# Patient Record
Sex: Female | Born: 1945 | Race: White | Hispanic: No | Marital: Married | Smoking: Never smoker
Health system: Southern US, Community
[De-identification: ages and names within clinical notes are randomized; demographics above are authoritative.]

## PROBLEM LIST (undated history)

## (undated) DIAGNOSIS — M8580 Other specified disorders of bone density and structure, unspecified site: Secondary | ICD-10-CM

## (undated) DIAGNOSIS — K219 Gastro-esophageal reflux disease without esophagitis: Secondary | ICD-10-CM

## (undated) DIAGNOSIS — Z8619 Personal history of other infectious and parasitic diseases: Secondary | ICD-10-CM

## (undated) DIAGNOSIS — E785 Hyperlipidemia, unspecified: Secondary | ICD-10-CM

## (undated) DIAGNOSIS — D649 Anemia, unspecified: Secondary | ICD-10-CM

## (undated) DIAGNOSIS — M797 Fibromyalgia: Secondary | ICD-10-CM

## (undated) DIAGNOSIS — M353 Polymyalgia rheumatica: Secondary | ICD-10-CM

## (undated) HISTORY — DX: Hyperlipidemia, unspecified: E78.5

## (undated) HISTORY — DX: Other specified disorders of bone density and structure, unspecified site: M85.80

## (undated) HISTORY — DX: Fibromyalgia: M79.7

## (undated) HISTORY — DX: Polymyalgia rheumatica: M35.3

## (undated) HISTORY — DX: Anemia, unspecified: D64.9

## (undated) HISTORY — DX: Gastro-esophageal reflux disease without esophagitis: K21.9

## (undated) HISTORY — PX: OTHER SURGICAL HISTORY: SHX169

## (undated) HISTORY — DX: Personal history of other infectious and parasitic diseases: Z86.19

---

## 1980-04-27 HISTORY — PX: ABDOMINAL HYSTERECTOMY: SHX81

## 1998-03-06 ENCOUNTER — Other Ambulatory Visit: Admission: RE | Admit: 1998-03-06 | Discharge: 1998-03-06 | Payer: Self-pay | Admitting: Family Medicine

## 1999-09-10 ENCOUNTER — Ambulatory Visit (HOSPITAL_COMMUNITY): Admission: RE | Admit: 1999-09-10 | Discharge: 1999-09-10 | Payer: Self-pay | Admitting: Gastroenterology

## 2002-08-11 ENCOUNTER — Encounter (INDEPENDENT_AMBULATORY_CARE_PROVIDER_SITE_OTHER): Payer: Self-pay | Admitting: Specialist

## 2002-08-11 ENCOUNTER — Ambulatory Visit (HOSPITAL_COMMUNITY): Admission: RE | Admit: 2002-08-11 | Discharge: 2002-08-11 | Payer: Self-pay | Admitting: Gastroenterology

## 2003-04-28 DIAGNOSIS — M353 Polymyalgia rheumatica: Secondary | ICD-10-CM

## 2003-04-28 HISTORY — DX: Polymyalgia rheumatica: M35.3

## 2003-08-20 ENCOUNTER — Encounter: Admission: RE | Admit: 2003-08-20 | Discharge: 2003-08-20 | Payer: Self-pay

## 2003-10-31 ENCOUNTER — Ambulatory Visit (HOSPITAL_COMMUNITY): Admission: RE | Admit: 2003-10-31 | Discharge: 2003-10-31 | Payer: Self-pay | Admitting: Unknown Physician Specialty

## 2004-02-01 ENCOUNTER — Emergency Department (HOSPITAL_COMMUNITY): Admission: EM | Admit: 2004-02-01 | Discharge: 2004-02-02 | Payer: Self-pay | Admitting: Emergency Medicine

## 2004-04-27 DIAGNOSIS — M797 Fibromyalgia: Secondary | ICD-10-CM

## 2004-04-27 HISTORY — DX: Fibromyalgia: M79.7

## 2005-01-27 ENCOUNTER — Encounter: Admission: RE | Admit: 2005-01-27 | Discharge: 2005-01-27 | Payer: Self-pay | Admitting: Gastroenterology

## 2005-02-04 ENCOUNTER — Encounter: Admission: RE | Admit: 2005-02-04 | Discharge: 2005-02-04 | Payer: Self-pay | Admitting: Gastroenterology

## 2005-03-30 ENCOUNTER — Encounter: Admission: RE | Admit: 2005-03-30 | Discharge: 2005-03-30 | Payer: Self-pay | Admitting: Gastroenterology

## 2006-06-07 ENCOUNTER — Ambulatory Visit: Payer: Self-pay | Admitting: Oncology

## 2006-07-02 LAB — COMPREHENSIVE METABOLIC PANEL
ALT: 18 U/L (ref 0–35)
AST: 14 U/L (ref 0–37)
CO2: 29 mEq/L (ref 19–32)
Calcium: 9.2 mg/dL (ref 8.4–10.5)
Chloride: 102 mEq/L (ref 96–112)
Potassium: 4.6 mEq/L (ref 3.5–5.3)
Sodium: 139 mEq/L (ref 135–145)
Total Protein: 7 g/dL (ref 6.0–8.3)

## 2006-07-02 LAB — CBC & DIFF AND RETIC
BASO%: 0 % (ref 0.0–2.0)
EOS%: 0 % (ref 0.0–7.0)
IRF: 0.41 — ABNORMAL HIGH (ref 0.130–0.330)
MCH: 25.8 pg — ABNORMAL LOW (ref 26.0–34.0)
MCHC: 32.3 g/dL (ref 32.0–36.0)
RBC: 4.83 10*6/uL (ref 3.70–5.32)
RDW: 19 % — ABNORMAL HIGH (ref 11.3–14.5)
RETIC #: 49.3 10*3/uL (ref 19.7–115.1)
lymph#: 0.7 10*3/uL — ABNORMAL LOW (ref 0.9–3.3)

## 2006-07-02 LAB — IRON AND TIBC: UIBC: 312 ug/dL

## 2006-07-02 LAB — VITAMIN B12: Vitamin B-12: 428 pg/mL (ref 211–911)

## 2006-07-02 LAB — LACTATE DEHYDROGENASE: LDH: 155 U/L (ref 94–250)

## 2006-07-10 LAB — INTRINSIC FACTOR ANTIBODIES: Intrinsic Factor: NEGATIVE

## 2006-07-10 LAB — PARIETAL CELL ANTIBODY, TOTAL: Parietal Cell Antibody-IgG: 1:40 {titer} — AB

## 2006-08-30 ENCOUNTER — Ambulatory Visit: Payer: Self-pay | Admitting: Oncology

## 2006-09-01 LAB — CBC WITH DIFFERENTIAL/PLATELET
BASO%: 0.3 % (ref 0.0–2.0)
Basophils Absolute: 0 10*3/uL (ref 0.0–0.1)
EOS%: 0.9 % (ref 0.0–7.0)
HGB: 14 g/dL (ref 11.6–15.9)
MCH: 30.2 pg (ref 26.0–34.0)
NEUT#: 8.5 10*3/uL — ABNORMAL HIGH (ref 1.5–6.5)
RBC: 4.63 10*6/uL (ref 3.70–5.32)
RDW: 20.1 % — ABNORMAL HIGH (ref 11.3–14.5)
lymph#: 1.2 10*3/uL (ref 0.9–3.3)

## 2006-09-01 LAB — ERYTHROCYTE SEDIMENTATION RATE: Sed Rate: 8 mm/hr (ref 0–30)

## 2006-09-01 LAB — FERRITIN: Ferritin: 270 ng/mL (ref 10–291)

## 2006-09-01 LAB — IRON AND TIBC
Iron: 65 ug/dL (ref 42–145)
UIBC: 166 ug/dL

## 2006-10-26 ENCOUNTER — Ambulatory Visit: Payer: Self-pay | Admitting: Oncology

## 2006-10-28 LAB — CBC WITH DIFFERENTIAL/PLATELET
Basophils Absolute: 0 10*3/uL (ref 0.0–0.1)
Eosinophils Absolute: 0.1 10*3/uL (ref 0.0–0.5)
HGB: 14.5 g/dL (ref 11.6–15.9)
MCV: 92.3 fL (ref 81.0–101.0)
NEUT#: 5.4 10*3/uL (ref 1.5–6.5)
RDW: 13 % (ref 11.3–14.5)
lymph#: 1.3 10*3/uL (ref 0.9–3.3)

## 2006-10-28 LAB — LACTATE DEHYDROGENASE: LDH: 146 U/L (ref 94–250)

## 2006-10-28 LAB — COMPREHENSIVE METABOLIC PANEL
Albumin: 4.3 g/dL (ref 3.5–5.2)
CO2: 29 mEq/L (ref 19–32)
Glucose, Bld: 75 mg/dL (ref 70–99)
Potassium: 4 mEq/L (ref 3.5–5.3)
Sodium: 141 mEq/L (ref 135–145)
Total Protein: 6.8 g/dL (ref 6.0–8.3)

## 2006-10-28 LAB — FERRITIN: Ferritin: 283 ng/mL (ref 10–291)

## 2006-10-28 LAB — IRON AND TIBC: %SAT: 63 % — ABNORMAL HIGH (ref 20–55)

## 2007-03-01 ENCOUNTER — Ambulatory Visit: Payer: Self-pay | Admitting: Oncology

## 2007-03-03 LAB — COMPREHENSIVE METABOLIC PANEL
Albumin: 4.1 g/dL (ref 3.5–5.2)
CO2: 29 mEq/L (ref 19–32)
Calcium: 9.4 mg/dL (ref 8.4–10.5)
Chloride: 101 mEq/L (ref 96–112)
Glucose, Bld: 79 mg/dL (ref 70–99)
Sodium: 138 mEq/L (ref 135–145)
Total Bilirubin: 0.5 mg/dL (ref 0.3–1.2)
Total Protein: 6.9 g/dL (ref 6.0–8.3)

## 2007-03-03 LAB — LACTATE DEHYDROGENASE: LDH: 162 U/L (ref 94–250)

## 2007-03-03 LAB — CBC WITH DIFFERENTIAL/PLATELET
Basophils Absolute: 0 10*3/uL (ref 0.0–0.1)
Eosinophils Absolute: 0.1 10*3/uL (ref 0.0–0.5)
HGB: 14.9 g/dL (ref 11.6–15.9)
MCV: 92.9 fL (ref 81.0–101.0)
MONO%: 2.4 % (ref 0.0–13.0)
NEUT#: 7.6 10*3/uL — ABNORMAL HIGH (ref 1.5–6.5)
Platelets: 235 10*3/uL (ref 145–400)
RDW: 13.1 % (ref 11.3–14.5)

## 2007-03-03 LAB — IRON AND TIBC
%SAT: 40 % (ref 20–55)
Iron: 100 ug/dL (ref 42–145)
TIBC: 247 ug/dL — ABNORMAL LOW (ref 250–470)
UIBC: 147 ug/dL

## 2007-03-03 LAB — FERRITIN: Ferritin: 233 ng/mL (ref 10–291)

## 2007-06-28 ENCOUNTER — Ambulatory Visit: Payer: Self-pay | Admitting: Oncology

## 2007-06-30 LAB — CBC WITH DIFFERENTIAL/PLATELET
BASO%: 0 % (ref 0.0–2.0)
EOS%: 0.1 % (ref 0.0–7.0)
MCH: 31.8 pg (ref 26.0–34.0)
MCV: 92.3 fL (ref 81.0–101.0)
MONO%: 1.3 % (ref 0.0–13.0)
RBC: 4.44 10*6/uL (ref 3.70–5.32)
RDW: 13.1 % (ref 11.3–14.5)
lymph#: 0.7 10*3/uL — ABNORMAL LOW (ref 0.9–3.3)

## 2007-06-30 LAB — VITAMIN B12: Vitamin B-12: 521 pg/mL (ref 211–911)

## 2007-06-30 LAB — COMPREHENSIVE METABOLIC PANEL
ALT: 88 U/L — ABNORMAL HIGH (ref 0–35)
AST: 41 U/L — ABNORMAL HIGH (ref 0–37)
Alkaline Phosphatase: 146 U/L — ABNORMAL HIGH (ref 39–117)
Potassium: 4.6 mEq/L (ref 3.5–5.3)
Sodium: 140 mEq/L (ref 135–145)
Total Bilirubin: 0.4 mg/dL (ref 0.3–1.2)
Total Protein: 6.4 g/dL (ref 6.0–8.3)

## 2007-06-30 LAB — IRON AND TIBC
%SAT: 49 % (ref 20–55)
TIBC: 209 ug/dL — ABNORMAL LOW (ref 250–470)
UIBC: 106 ug/dL

## 2007-06-30 LAB — ERYTHROCYTE SEDIMENTATION RATE: Sed Rate: 5 mm/hr (ref 0–30)

## 2007-06-30 LAB — FERRITIN: Ferritin: 414 ng/mL — ABNORMAL HIGH (ref 10–291)

## 2008-01-11 ENCOUNTER — Ambulatory Visit: Payer: Self-pay | Admitting: Oncology

## 2008-01-13 LAB — VITAMIN B12: Vitamin B-12: 303 pg/mL (ref 211–911)

## 2008-01-13 LAB — IRON AND TIBC
%SAT: 44 % (ref 20–55)
TIBC: 209 ug/dL — ABNORMAL LOW (ref 250–470)
UIBC: 116 ug/dL

## 2008-01-13 LAB — CBC WITH DIFFERENTIAL/PLATELET
BASO%: 0.5 % (ref 0.0–2.0)
Basophils Absolute: 0 10*3/uL (ref 0.0–0.1)
EOS%: 1.1 % (ref 0.0–7.0)
HCT: 40.4 % (ref 34.8–46.6)
HGB: 13.8 g/dL (ref 11.6–15.9)
LYMPH%: 25.9 % (ref 14.0–48.0)
MCH: 32 pg (ref 26.0–34.0)
MCHC: 34 g/dL (ref 32.0–36.0)
MCV: 94.2 fL (ref 81.0–101.0)
MONO%: 10.8 % (ref 0.0–13.0)
NEUT%: 61.7 % (ref 39.6–76.8)
Platelets: 227 10*3/uL (ref 145–400)
lymph#: 1.6 10*3/uL (ref 0.9–3.3)

## 2008-01-13 LAB — COMPREHENSIVE METABOLIC PANEL
AST: 22 U/L (ref 0–37)
BUN: 20 mg/dL (ref 6–23)
Calcium: 9.1 mg/dL (ref 8.4–10.5)
Chloride: 104 mEq/L (ref 96–112)
Creatinine, Ser: 0.98 mg/dL (ref 0.40–1.20)
Total Bilirubin: 0.5 mg/dL (ref 0.3–1.2)

## 2008-01-13 LAB — LACTATE DEHYDROGENASE: LDH: 144 U/L (ref 94–250)

## 2008-02-21 ENCOUNTER — Encounter: Admission: RE | Admit: 2008-02-21 | Discharge: 2008-02-21 | Payer: Self-pay | Admitting: Gastroenterology

## 2008-07-10 ENCOUNTER — Ambulatory Visit: Payer: Self-pay | Admitting: Oncology

## 2008-07-12 LAB — COMPREHENSIVE METABOLIC PANEL
AST: 47 U/L — ABNORMAL HIGH (ref 0–37)
Albumin: 4.1 g/dL (ref 3.5–5.2)
Alkaline Phosphatase: 119 U/L — ABNORMAL HIGH (ref 39–117)
BUN: 22 mg/dL (ref 6–23)
Potassium: 4 mEq/L (ref 3.5–5.3)
Sodium: 142 mEq/L (ref 135–145)
Total Bilirubin: 0.4 mg/dL (ref 0.3–1.2)

## 2008-07-12 LAB — CBC WITH DIFFERENTIAL/PLATELET
BASO%: 0.4 % (ref 0.0–2.0)
EOS%: 0.3 % (ref 0.0–7.0)
MCH: 31.4 pg (ref 25.1–34.0)
MCHC: 33.4 g/dL (ref 31.5–36.0)
MCV: 93.9 fL (ref 79.5–101.0)
MONO%: 6.6 % (ref 0.0–14.0)
NEUT%: 77.6 % — ABNORMAL HIGH (ref 38.4–76.8)
RDW: 12.5 % (ref 11.2–14.5)
lymph#: 1 10*3/uL (ref 0.9–3.3)

## 2008-07-12 LAB — IRON AND TIBC
Iron: 144 ug/dL (ref 42–145)
UIBC: 90 ug/dL

## 2008-07-12 LAB — LACTATE DEHYDROGENASE: LDH: 141 U/L (ref 94–250)

## 2008-07-12 LAB — ERYTHROCYTE SEDIMENTATION RATE: Sed Rate: 11 mm/hr (ref 0–30)

## 2008-11-20 ENCOUNTER — Encounter: Admission: RE | Admit: 2008-11-20 | Discharge: 2008-11-20 | Payer: Self-pay | Admitting: Internal Medicine

## 2008-12-06 ENCOUNTER — Ambulatory Visit: Payer: Self-pay | Admitting: Oncology

## 2008-12-10 LAB — IRON AND TIBC
%SAT: 33 % (ref 20–55)
Iron: 57 ug/dL (ref 42–145)
TIBC: 173 ug/dL — ABNORMAL LOW (ref 250–470)

## 2008-12-10 LAB — CBC WITH DIFFERENTIAL/PLATELET
Basophils Absolute: 0 10*3/uL (ref 0.0–0.1)
Eosinophils Absolute: 0.1 10*3/uL (ref 0.0–0.5)
HGB: 12.2 g/dL (ref 11.6–15.9)
LYMPH%: 16.1 % (ref 14.0–49.7)
MCH: 30.4 pg (ref 25.1–34.0)
MCV: 91.1 fL (ref 79.5–101.0)
MONO%: 11.2 % (ref 0.0–14.0)
NEUT#: 3.8 10*3/uL (ref 1.5–6.5)
NEUT%: 70.2 % (ref 38.4–76.8)
Platelets: 290 10*3/uL (ref 145–400)

## 2008-12-10 LAB — COMPREHENSIVE METABOLIC PANEL
BUN: 14 mg/dL (ref 6–23)
CO2: 29 mEq/L (ref 19–32)
Creatinine, Ser: 0.75 mg/dL (ref 0.40–1.20)
Glucose, Bld: 87 mg/dL (ref 70–99)
Total Bilirubin: 0.5 mg/dL (ref 0.3–1.2)
Total Protein: 6.4 g/dL (ref 6.0–8.3)

## 2008-12-10 LAB — LACTATE DEHYDROGENASE: LDH: 144 U/L (ref 94–250)

## 2008-12-10 LAB — FERRITIN: Ferritin: 470 ng/mL — ABNORMAL HIGH (ref 10–291)

## 2009-03-08 ENCOUNTER — Ambulatory Visit: Payer: Self-pay | Admitting: Oncology

## 2009-03-12 LAB — CBC WITH DIFFERENTIAL/PLATELET
BASO%: 0.2 % (ref 0.0–2.0)
Basophils Absolute: 0 10*3/uL (ref 0.0–0.1)
HCT: 42.6 % (ref 34.8–46.6)
HGB: 14.2 g/dL (ref 11.6–15.9)
MCHC: 33.3 g/dL (ref 31.5–36.0)
MONO#: 0.8 10*3/uL (ref 0.1–0.9)
NEUT#: 5.5 10*3/uL (ref 1.5–6.5)
NEUT%: 66.1 % (ref 38.4–76.8)
WBC: 8.4 10*3/uL (ref 3.9–10.3)
lymph#: 2 10*3/uL (ref 0.9–3.3)

## 2009-03-12 LAB — IRON AND TIBC
Iron: 83 ug/dL (ref 42–145)
TIBC: 224 ug/dL — ABNORMAL LOW (ref 250–470)
UIBC: 141 ug/dL

## 2009-03-12 LAB — COMPREHENSIVE METABOLIC PANEL
Albumin: 4.1 g/dL (ref 3.5–5.2)
BUN: 17 mg/dL (ref 6–23)
CO2: 27 mEq/L (ref 19–32)
Glucose, Bld: 67 mg/dL — ABNORMAL LOW (ref 70–99)
Sodium: 144 mEq/L (ref 135–145)
Total Bilirubin: 0.3 mg/dL (ref 0.3–1.2)
Total Protein: 6.6 g/dL (ref 6.0–8.3)

## 2009-03-12 LAB — FERRITIN: Ferritin: 181 ng/mL (ref 10–291)

## 2009-06-10 ENCOUNTER — Ambulatory Visit: Payer: Self-pay | Admitting: Oncology

## 2009-06-13 LAB — CBC WITH DIFFERENTIAL/PLATELET
BASO%: 0.2 % (ref 0.0–2.0)
Basophils Absolute: 0 10*3/uL (ref 0.0–0.1)
EOS%: 0.1 % (ref 0.0–7.0)
Eosinophils Absolute: 0 10*3/uL (ref 0.0–0.5)
HCT: 38.1 % (ref 34.8–46.6)
HGB: 12.9 g/dL (ref 11.6–15.9)
LYMPH%: 7.8 % — ABNORMAL LOW (ref 14.0–49.7)
MCH: 31.1 pg (ref 25.1–34.0)
MCHC: 34 g/dL (ref 31.5–36.0)
MCV: 91.7 fL (ref 79.5–101.0)
MONO#: 0.3 10*3/uL (ref 0.1–0.9)
MONO%: 3.1 % (ref 0.0–14.0)
NEUT#: 8.1 10*3/uL — ABNORMAL HIGH (ref 1.5–6.5)
NEUT%: 88.8 % — ABNORMAL HIGH (ref 38.4–76.8)
Platelets: 283 10*3/uL (ref 145–400)
RBC: 4.16 10*6/uL (ref 3.70–5.45)
RDW: 12.1 % (ref 11.2–14.5)
WBC: 9.2 10*3/uL (ref 3.9–10.3)
lymph#: 0.7 10*3/uL — ABNORMAL LOW (ref 0.9–3.3)

## 2009-06-13 LAB — COMPREHENSIVE METABOLIC PANEL
ALT: 23 U/L (ref 0–35)
AST: 25 U/L (ref 0–37)
Albumin: 3.6 g/dL (ref 3.5–5.2)
Alkaline Phosphatase: 90 U/L (ref 39–117)
BUN: 15 mg/dL (ref 6–23)
CO2: 31 mEq/L (ref 19–32)
Calcium: 9.2 mg/dL (ref 8.4–10.5)
Chloride: 102 mEq/L (ref 96–112)
Creatinine, Ser: 0.93 mg/dL (ref 0.40–1.20)
Glucose, Bld: 88 mg/dL (ref 70–99)
Potassium: 4.4 mEq/L (ref 3.5–5.3)
Sodium: 139 mEq/L (ref 135–145)
Total Bilirubin: 0.6 mg/dL (ref 0.3–1.2)
Total Protein: 6.8 g/dL (ref 6.0–8.3)

## 2009-06-13 LAB — FERRITIN: Ferritin: 89 ng/mL (ref 10–291)

## 2009-06-13 LAB — IRON AND TIBC
%SAT: 19 % — ABNORMAL LOW (ref 20–55)
Iron: 41 ug/dL — ABNORMAL LOW (ref 42–145)
TIBC: 221 ug/dL — ABNORMAL LOW (ref 250–470)
UIBC: 180 ug/dL

## 2009-06-13 LAB — VITAMIN B12: Vitamin B-12: 379 pg/mL (ref 211–911)

## 2009-06-13 LAB — LACTATE DEHYDROGENASE: LDH: 160 U/L (ref 94–250)

## 2009-06-13 LAB — SEDIMENTATION RATE: Sed Rate: 17 mm/hr (ref 0–22)

## 2009-06-24 LAB — FECAL OCCULT BLOOD, GUAIAC: Occult Blood: NEGATIVE

## 2009-10-07 ENCOUNTER — Ambulatory Visit: Payer: Self-pay | Admitting: Oncology

## 2009-10-09 LAB — CBC WITH DIFFERENTIAL/PLATELET
BASO%: 0.3 % (ref 0.0–2.0)
Basophils Absolute: 0 10*3/uL (ref 0.0–0.1)
EOS%: 1 % (ref 0.0–7.0)
Eosinophils Absolute: 0.1 10*3/uL (ref 0.0–0.5)
HCT: 33.5 % — ABNORMAL LOW (ref 34.8–46.6)
HGB: 11 g/dL — ABNORMAL LOW (ref 11.6–15.9)
LYMPH%: 20.3 % (ref 14.0–49.7)
MCH: 27.8 pg (ref 25.1–34.0)
MCHC: 32.9 g/dL (ref 31.5–36.0)
MCV: 84.5 fL (ref 79.5–101.0)
MONO#: 0.8 10*3/uL (ref 0.1–0.9)
MONO%: 11.5 % (ref 0.0–14.0)
NEUT#: 4.6 10*3/uL (ref 1.5–6.5)
NEUT%: 66.9 % (ref 38.4–76.8)
Platelets: 290 10*3/uL (ref 145–400)
RBC: 3.97 10*6/uL (ref 3.70–5.45)
RDW: 13.3 % (ref 11.2–14.5)
WBC: 6.9 10*3/uL (ref 3.9–10.3)
lymph#: 1.4 10*3/uL (ref 0.9–3.3)

## 2009-10-10 LAB — COMPREHENSIVE METABOLIC PANEL
ALT: 18 U/L (ref 0–35)
AST: 20 U/L (ref 0–37)
Albumin: 3.8 g/dL (ref 3.5–5.2)
Alkaline Phosphatase: 94 U/L (ref 39–117)
BUN: 17 mg/dL (ref 6–23)
CO2: 29 mEq/L (ref 19–32)
Calcium: 8.7 mg/dL (ref 8.4–10.5)
Chloride: 102 mEq/L (ref 96–112)
Creatinine, Ser: 0.87 mg/dL (ref 0.40–1.20)
Glucose, Bld: 91 mg/dL (ref 70–99)
Potassium: 4.1 mEq/L (ref 3.5–5.3)
Sodium: 140 mEq/L (ref 135–145)
Total Bilirubin: 0.4 mg/dL (ref 0.3–1.2)
Total Protein: 6.3 g/dL (ref 6.0–8.3)

## 2009-10-10 LAB — IRON AND TIBC
%SAT: 12 % — ABNORMAL LOW (ref 20–55)
Iron: 36 ug/dL — ABNORMAL LOW (ref 42–145)
TIBC: 296 ug/dL (ref 250–470)
UIBC: 260 ug/dL

## 2009-10-10 LAB — TRANSFERRIN RECEPTOR, SOLUABLE: Transferrin Receptor, Soluble: 32 nmol/L

## 2009-10-10 LAB — FERRITIN: Ferritin: 9 ng/mL — ABNORMAL LOW (ref 10–291)

## 2009-10-10 LAB — VITAMIN B12: Vitamin B-12: >2000 pg/mL — ABNORMAL HIGH (ref 211–911)

## 2009-10-10 LAB — LACTATE DEHYDROGENASE: LDH: 129 U/L (ref 94–250)

## 2009-10-10 LAB — SEDIMENTATION RATE: Sed Rate: 17 mm/hr (ref 0–22)

## 2009-11-29 ENCOUNTER — Ambulatory Visit: Payer: Self-pay | Admitting: Oncology

## 2009-12-03 LAB — LACTATE DEHYDROGENASE: LDH: 138 U/L (ref 94–250)

## 2009-12-03 LAB — COMPREHENSIVE METABOLIC PANEL
ALT: 31 U/L (ref 0–35)
AST: 29 U/L (ref 0–37)
Albumin: 3.5 g/dL (ref 3.5–5.2)
Alkaline Phosphatase: 93 U/L (ref 39–117)
BUN: 16 mg/dL (ref 6–23)
CO2: 29 mEq/L (ref 19–32)
Calcium: 8.9 mg/dL (ref 8.4–10.5)
Chloride: 105 mEq/L (ref 96–112)
Creatinine, Ser: 0.86 mg/dL (ref 0.40–1.20)
Glucose, Bld: 90 mg/dL (ref 70–99)
Potassium: 4.3 mEq/L (ref 3.5–5.3)
Sodium: 139 mEq/L (ref 135–145)
Total Bilirubin: 0.6 mg/dL (ref 0.3–1.2)
Total Protein: 6.4 g/dL (ref 6.0–8.3)

## 2009-12-03 LAB — CBC WITH DIFFERENTIAL/PLATELET
BASO%: 0 % (ref 0.0–2.0)
Basophils Absolute: 0 10*3/uL (ref 0.0–0.1)
EOS%: 0.1 % (ref 0.0–7.0)
Eosinophils Absolute: 0 10*3/uL (ref 0.0–0.5)
HCT: 37.7 % (ref 34.8–46.6)
HGB: 12.8 g/dL (ref 11.6–15.9)
LYMPH%: 7 % — ABNORMAL LOW (ref 14.0–49.7)
MCH: 30.3 pg (ref 25.1–34.0)
MCHC: 33.9 g/dL (ref 31.5–36.0)
MCV: 89.4 fL (ref 79.5–101.0)
MONO#: 0.3 10*3/uL (ref 0.1–0.9)
MONO%: 4 % (ref 0.0–14.0)
NEUT#: 6.6 10*3/uL — ABNORMAL HIGH (ref 1.5–6.5)
NEUT%: 88.9 % — ABNORMAL HIGH (ref 38.4–76.8)
Platelets: 240 10*3/uL (ref 145–400)
RBC: 4.22 10*6/uL (ref 3.70–5.45)
RDW: 20.9 % — ABNORMAL HIGH (ref 11.2–14.5)
WBC: 7.5 10*3/uL (ref 3.9–10.3)
lymph#: 0.5 10*3/uL — ABNORMAL LOW (ref 0.9–3.3)

## 2009-12-03 LAB — VITAMIN B12: Vitamin B-12: 251 pg/mL (ref 211–911)

## 2009-12-03 LAB — FERRITIN: Ferritin: 403 ng/mL — ABNORMAL HIGH (ref 10–291)

## 2009-12-03 LAB — IRON AND TIBC
%SAT: 31 % (ref 20–55)
Iron: 61 ug/dL (ref 42–145)
TIBC: 197 ug/dL — ABNORMAL LOW (ref 250–470)
UIBC: 136 ug/dL

## 2010-02-11 ENCOUNTER — Ambulatory Visit: Payer: Self-pay | Admitting: Oncology

## 2010-02-13 LAB — CBC WITH DIFFERENTIAL/PLATELET
BASO%: 0 % (ref 0.0–2.0)
Basophils Absolute: 0 10*3/uL (ref 0.0–0.1)
EOS%: 0.1 % (ref 0.0–7.0)
Eosinophils Absolute: 0 10*3/uL (ref 0.0–0.5)
HCT: 40.3 % (ref 34.8–46.6)
HGB: 13.8 g/dL (ref 11.6–15.9)
LYMPH%: 7.5 % — ABNORMAL LOW (ref 14.0–49.7)
MCH: 32.1 pg (ref 25.1–34.0)
MCHC: 34.2 g/dL (ref 31.5–36.0)
MCV: 93.8 fL (ref 79.5–101.0)
MONO#: 0.4 10*3/uL (ref 0.1–0.9)
MONO%: 5 % (ref 0.0–14.0)
NEUT#: 7.1 10*3/uL — ABNORMAL HIGH (ref 1.5–6.5)
NEUT%: 87.4 % — ABNORMAL HIGH (ref 38.4–76.8)
Platelets: 226 10*3/uL (ref 145–400)
RBC: 4.3 10*6/uL (ref 3.70–5.45)
RDW: 12.4 % (ref 11.2–14.5)
WBC: 8.1 10*3/uL (ref 3.9–10.3)
lymph#: 0.6 10*3/uL — ABNORMAL LOW (ref 0.9–3.3)

## 2010-02-14 LAB — IRON AND TIBC
%SAT: 48 % (ref 20–55)
Iron: 98 ug/dL (ref 42–145)
TIBC: 205 ug/dL — ABNORMAL LOW (ref 250–470)
UIBC: 107 ug/dL

## 2010-02-14 LAB — COMPREHENSIVE METABOLIC PANEL
ALT: 16 U/L (ref 0–35)
AST: 22 U/L (ref 0–37)
Albumin: 4 g/dL (ref 3.5–5.2)
Alkaline Phosphatase: 72 U/L (ref 39–117)
BUN: 19 mg/dL (ref 6–23)
CO2: 26 mEq/L (ref 19–32)
Calcium: 9.2 mg/dL (ref 8.4–10.5)
Chloride: 102 mEq/L (ref 96–112)
Creatinine, Ser: 0.93 mg/dL (ref 0.40–1.20)
Glucose, Bld: 89 mg/dL (ref 70–99)
Potassium: 4.2 mEq/L (ref 3.5–5.3)
Sodium: 139 mEq/L (ref 135–145)
Total Bilirubin: 0.4 mg/dL (ref 0.3–1.2)
Total Protein: 6.5 g/dL (ref 6.0–8.3)

## 2010-02-14 LAB — VITAMIN B12: Vitamin B-12: 343 pg/mL (ref 211–911)

## 2010-02-14 LAB — LACTATE DEHYDROGENASE: LDH: 148 U/L (ref 94–250)

## 2010-02-14 LAB — SEDIMENTATION RATE: Sed Rate: 7 mm/hr (ref 0–22)

## 2010-02-14 LAB — TRANSFERRIN RECEPTOR, SOLUABLE: Transferrin Receptor, Soluble: 16.9 nmol/L

## 2010-02-14 LAB — FERRITIN: Ferritin: 179 ng/mL (ref 10–291)

## 2010-04-23 ENCOUNTER — Ambulatory Visit: Payer: Self-pay | Admitting: Oncology

## 2010-04-29 LAB — CBC WITH DIFFERENTIAL/PLATELET
BASO%: 0.1 % (ref 0.0–2.0)
Basophils Absolute: 0 10*3/uL (ref 0.0–0.1)
EOS%: 0.8 % (ref 0.0–7.0)
Eosinophils Absolute: 0.1 10*3/uL (ref 0.0–0.5)
HCT: 40.3 % (ref 34.8–46.6)
HGB: 13.7 g/dL (ref 11.6–15.9)
LYMPH%: 16.2 % (ref 14.0–49.7)
MCH: 32.2 pg (ref 25.1–34.0)
MCHC: 34 g/dL (ref 31.5–36.0)
MCV: 94.6 fL (ref 79.5–101.0)
MONO#: 0.6 10*3/uL (ref 0.1–0.9)
MONO%: 7.9 % (ref 0.0–14.0)
NEUT#: 5.9 10*3/uL (ref 1.5–6.5)
NEUT%: 75 % (ref 38.4–76.8)
Platelets: 248 10*3/uL (ref 145–400)
RBC: 4.26 10*6/uL (ref 3.70–5.45)
RDW: 12.8 % (ref 11.2–14.5)
WBC: 7.9 10*3/uL (ref 3.9–10.3)
lymph#: 1.3 10*3/uL (ref 0.9–3.3)

## 2010-04-29 LAB — VITAMIN B12: Vitamin B-12: 206 pg/mL — ABNORMAL LOW (ref 211–911)

## 2010-05-01 LAB — IRON AND TIBC
%SAT: 41 % (ref 20–55)
Iron: 103 ug/dL (ref 42–145)
TIBC: 252 ug/dL (ref 250–470)
UIBC: 149 ug/dL

## 2010-05-01 LAB — COMPREHENSIVE METABOLIC PANEL
ALT: 11 U/L (ref 0–35)
AST: 16 U/L (ref 0–37)
Albumin: 3.7 g/dL (ref 3.5–5.2)
Alkaline Phosphatase: 63 U/L (ref 39–117)
BUN: 14 mg/dL (ref 6–23)
CO2: 27 mEq/L (ref 19–32)
Calcium: 9.5 mg/dL (ref 8.4–10.5)
Chloride: 103 mEq/L (ref 96–112)
Creatinine, Ser: 1 mg/dL (ref 0.40–1.20)
Glucose, Bld: 75 mg/dL (ref 70–99)
Potassium: 4 mEq/L (ref 3.5–5.3)
Sodium: 140 mEq/L (ref 135–145)
Total Bilirubin: 0.3 mg/dL (ref 0.3–1.2)
Total Protein: 6.1 g/dL (ref 6.0–8.3)

## 2010-05-01 LAB — FERRITIN: Ferritin: 165 ng/mL (ref 10–291)

## 2010-05-01 LAB — TRANSFERRIN RECEPTOR, SOLUABLE: Transferrin Receptor, Soluble: 11.7 nmol/L

## 2010-05-01 LAB — LACTATE DEHYDROGENASE: LDH: 107 U/L (ref 94–250)

## 2010-05-01 LAB — SEDIMENTATION RATE: Sed Rate: 1 mm/hr (ref 0–22)

## 2010-05-06 LAB — VITAMIN B12: Vitamin B-12: 270 pg/mL (ref 211–911)

## 2010-08-07 ENCOUNTER — Encounter (HOSPITAL_BASED_OUTPATIENT_CLINIC_OR_DEPARTMENT_OTHER): Payer: Medicare Other | Admitting: Oncology

## 2010-08-07 ENCOUNTER — Other Ambulatory Visit (HOSPITAL_COMMUNITY): Payer: Self-pay | Admitting: Oncology

## 2010-08-07 DIAGNOSIS — Z800 Family history of malignant neoplasm of digestive organs: Secondary | ICD-10-CM

## 2010-08-07 DIAGNOSIS — K625 Hemorrhage of anus and rectum: Secondary | ICD-10-CM

## 2010-08-07 DIAGNOSIS — D509 Iron deficiency anemia, unspecified: Secondary | ICD-10-CM

## 2010-08-07 LAB — CBC WITH DIFFERENTIAL/PLATELET
BASO%: 0.1 % (ref 0.0–2.0)
Basophils Absolute: 0 10*3/uL (ref 0.0–0.1)
Eosinophils Absolute: 0 10*3/uL (ref 0.0–0.5)
HCT: 39.8 % (ref 34.8–46.6)
HGB: 13.4 g/dL (ref 11.6–15.9)
MCH: 31.6 pg (ref 25.1–34.0)
MCHC: 33.8 g/dL (ref 31.5–36.0)
MONO#: 0.1 10*3/uL (ref 0.1–0.9)
MONO%: 1.5 % (ref 0.0–14.0)
NEUT#: 7.4 10*3/uL — ABNORMAL HIGH (ref 1.5–6.5)
NEUT%: 90.9 % — ABNORMAL HIGH (ref 38.4–76.8)
Platelets: 242 10*3/uL (ref 145–400)
RBC: 4.25 10*6/uL (ref 3.70–5.45)
WBC: 8.2 10*3/uL (ref 3.9–10.3)
lymph#: 0.6 10*3/uL — ABNORMAL LOW (ref 0.9–3.3)

## 2010-08-07 LAB — IRON AND TIBC
Iron: 74 ug/dL (ref 42–145)
TIBC: 264 ug/dL (ref 250–470)
UIBC: 190 ug/dL

## 2010-08-07 LAB — VITAMIN B12: Vitamin B-12: 596 pg/mL (ref 211–911)

## 2010-08-07 LAB — FERRITIN: Ferritin: 144 ng/mL (ref 10–291)

## 2010-09-08 ENCOUNTER — Encounter (HOSPITAL_BASED_OUTPATIENT_CLINIC_OR_DEPARTMENT_OTHER): Payer: Medicare Other | Admitting: Oncology

## 2010-09-08 ENCOUNTER — Other Ambulatory Visit (HOSPITAL_COMMUNITY): Payer: Self-pay | Admitting: Oncology

## 2010-09-08 DIAGNOSIS — Z800 Family history of malignant neoplasm of digestive organs: Secondary | ICD-10-CM

## 2010-09-08 DIAGNOSIS — K625 Hemorrhage of anus and rectum: Secondary | ICD-10-CM

## 2010-09-08 DIAGNOSIS — D510 Vitamin B12 deficiency anemia due to intrinsic factor deficiency: Secondary | ICD-10-CM

## 2010-09-08 DIAGNOSIS — D509 Iron deficiency anemia, unspecified: Secondary | ICD-10-CM

## 2010-09-08 LAB — CBC WITH DIFFERENTIAL/PLATELET
BASO%: 0.9 % (ref 0.0–2.0)
Basophils Absolute: 0.1 10*3/uL (ref 0.0–0.1)
EOS%: 0.2 % (ref 0.0–7.0)
HCT: 41.9 % (ref 34.8–46.6)
LYMPH%: 9.1 % — ABNORMAL LOW (ref 14.0–49.7)
MCH: 31.8 pg (ref 25.1–34.0)
MCHC: 33.6 g/dL (ref 31.5–36.0)
MCV: 94.6 fL (ref 79.5–101.0)
MONO#: 0.4 10*3/uL (ref 0.1–0.9)
MONO%: 4.4 % (ref 0.0–14.0)
NEUT#: 7.7 10*3/uL — ABNORMAL HIGH (ref 1.5–6.5)
NEUT%: 85.4 % — ABNORMAL HIGH (ref 38.4–76.8)
Platelets: 253 10*3/uL (ref 145–400)
RBC: 4.42 10*6/uL (ref 3.70–5.45)
RDW: 12.9 % (ref 11.2–14.5)
WBC: 9.1 10*3/uL (ref 3.9–10.3)
lymph#: 0.8 10*3/uL — ABNORMAL LOW (ref 0.9–3.3)

## 2010-09-08 LAB — COMPREHENSIVE METABOLIC PANEL
ALT: 20 U/L (ref 0–35)
CO2: 29 mEq/L (ref 19–32)
Creatinine, Ser: 0.9 mg/dL (ref 0.40–1.20)
Total Bilirubin: 0.3 mg/dL (ref 0.3–1.2)

## 2010-09-08 LAB — IRON AND TIBC
Iron: 113 ug/dL (ref 42–145)
UIBC: 172 ug/dL

## 2010-09-08 LAB — LACTATE DEHYDROGENASE: LDH: 136 U/L (ref 94–250)

## 2010-09-08 LAB — VITAMIN B12: Vitamin B-12: 451 pg/mL (ref 211–911)

## 2010-09-08 LAB — SEDIMENTATION RATE: Sed Rate: 4 mm/hr (ref 0–22)

## 2010-09-12 NOTE — Op Note (Signed)
   NAME:  Andrea Mata, Andrea Mata                    ACCOUNT NO.:  [id number]   MEDICAL RECORD NO.:  [id number]                   PATIENT TYPE:  AMB   LOCATION:  ENDO                                 FACILITY:  [facility]   PHYSICIAN:  John C. Madilyn Fireman, M.D.                 DATE OF BIRTH:  1946-02-26   DATE OF PROCEDURE:  08/11/2002  DATE OF DISCHARGE:                                 OPERATIVE REPORT   PROCEDURE:  Colonoscopy.   INDICATION FOR PROCEDURE:  Occasional rectal bleeding and rectal pressure in  a patient with a family history of colon cancer in both parents.  His last  colonoscopy was three years ago.   DESCRIPTION OF PROCEDURE:  The patient was placed in the left lateral  decubitus position and placed on the pulse monitor with continuous low-flow  oxygen delivered by nasal cannula.  She was sedated with 87.5 mcg IV  fentanyl and 8 mg IV Versed.  The Olympus video colonoscope was inserted  into the rectum and advanced to the cecum, confirmed by transillumination at  McBurney's point and visualization of the ileocecal valve and appendiceal  orifice.  The prep was good.  The cecum, ascending, transverse, and  descending colon all appeared normal with no masses, polyps, diverticula, or  other mucosal abnormalities.  Within the sigmoid colon, there was seen a 1  cm sessile polyp which was removed by snare.  The remainder of the sigmoid  and rectum appeared normal.  The scope was then withdrawn and the patient  returned to the recovery room in stable condition.  She tolerated the  procedure well, and there were no immediate complications.   IMPRESSION:  A 1 cm sigmoid polyp removed by snare.   PLAN:  Await histology and will probably repeat colonoscopy in three years.                                               John C. Madilyn Fireman, M.D.    JCH/MEDQ  D:  08/11/2002  T:  08/11/2002  Job:  [id number]   cc:   Al Decant. Janey Greaser, M.D.  [address]  [place]  [place] [address]  Fax:  [phone]

## 2010-09-12 NOTE — Procedures (Signed)
[facility]  Patient:    Andrea Mata, Andrea Mata                 MRN: [id number] Proc. Date: 09/10/99 Adm. Date:  [id number] Disc. Date: [id number] Attending:  Louie Bun CC:         Al Decant. Janey Greaser, M.D.                           Procedure Report  PROCEDURE PERFORMED:  ENDOSCOPIST:  Everardo All. Madilyn Fireman, M.D.  INDICATIONS FOR PROCEDURE:  Family history of colon cancer in both parents.  DESCRIPTION OF PROCEDURE:  The patient was placed in the left lateral decubitus position and placed on the pulse monitor and continuous low flow oxygen delivered by nasal cannula.  She was sedated with 50 mg IV Demerol and 6 mg IV Versed.  The Olympus video colonoscope was inserted into the rectum and advanced to the cecum, confirmed by transillumination of McBurneys point and visualization of the ileocecal valve and appendiceal orifice.  The prep was excellent.  The cecum, ascending, transverse, descending and sigmoid colon all appeared normal with no masses or polyps, diverticula or other mucosal abnormalities.  The rectum likewise appeared normal.  On retroflex view the anus did reveal some small internal hemorrhoids.  The colonoscope was then withdrawn and the patient returned to the recovery room in stable condition. The patient tolerated the procedure well.  There were no immediate complications.  IMPRESSION:  Internal hemorrhoids.  Otherwise normal colonoscopy.  PLAN:  Repeat colonoscopy in three years based on strong family history and every three to five years thereafter depending on presence or absence of polyps. DD:  09/10/99 TD:  09/12/99 Job: [id number]

## 2010-12-08 ENCOUNTER — Other Ambulatory Visit (HOSPITAL_COMMUNITY): Payer: Self-pay | Admitting: Oncology

## 2010-12-08 ENCOUNTER — Encounter (HOSPITAL_BASED_OUTPATIENT_CLINIC_OR_DEPARTMENT_OTHER): Payer: Medicare Other | Admitting: Oncology

## 2010-12-08 DIAGNOSIS — D510 Vitamin B12 deficiency anemia due to intrinsic factor deficiency: Secondary | ICD-10-CM

## 2010-12-08 DIAGNOSIS — Z800 Family history of malignant neoplasm of digestive organs: Secondary | ICD-10-CM

## 2010-12-08 DIAGNOSIS — K625 Hemorrhage of anus and rectum: Secondary | ICD-10-CM

## 2010-12-08 LAB — CBC WITH DIFFERENTIAL/PLATELET
Basophils Absolute: 0 10*3/uL (ref 0.0–0.1)
EOS%: 0.4 % (ref 0.0–7.0)
HCT: 39 % (ref 34.8–46.6)
HGB: 13.3 g/dL (ref 11.6–15.9)
LYMPH%: 9.1 % — ABNORMAL LOW (ref 14.0–49.7)
MCH: 32.2 pg (ref 25.1–34.0)
MCV: 94.2 fL (ref 79.5–101.0)
MONO%: 4.7 % (ref 0.0–14.0)
NEUT%: 85.5 % — ABNORMAL HIGH (ref 38.4–76.8)

## 2010-12-08 LAB — VITAMIN B12: Vitamin B-12: 474 pg/mL (ref 211–911)

## 2011-02-11 ENCOUNTER — Encounter: Payer: Self-pay | Admitting: Medical Oncology

## 2011-02-14 IMAGING — US US ABDOMEN COMPLETE
1 series · 13 of 25 positions shown · non-contrast
Comparison: Ultrasound of the abdomen of 02/21/2008 and CT abdomen
of 01/27/2005

CLINICAL DATA: Right upper quadrant abdominal pain, elevated
alkaline phosphatase

COMPLETE ABDOMINAL ULTRASOUND

[Series 1: us abdomen complete · 0.24mm/px · 13 of 76 slices shown]
[im 1/76]
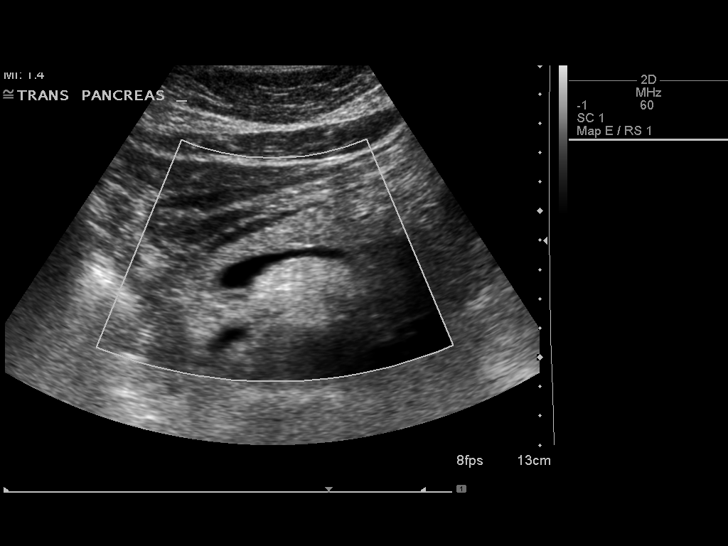
[im 7/76]
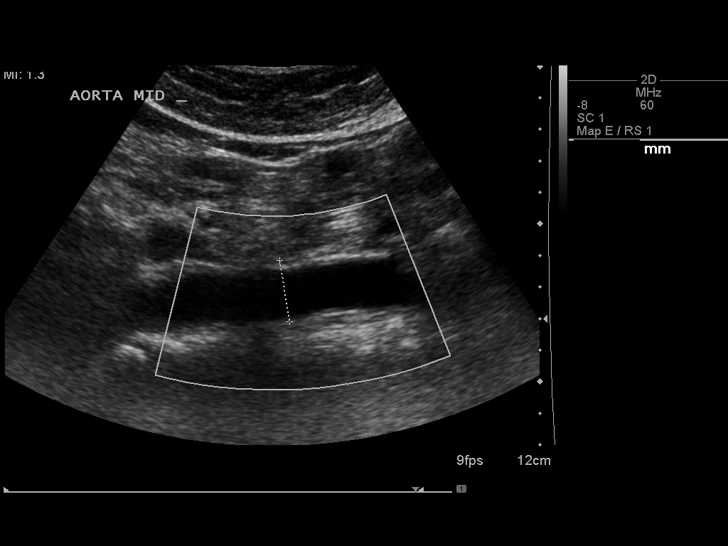
[im 13/76]
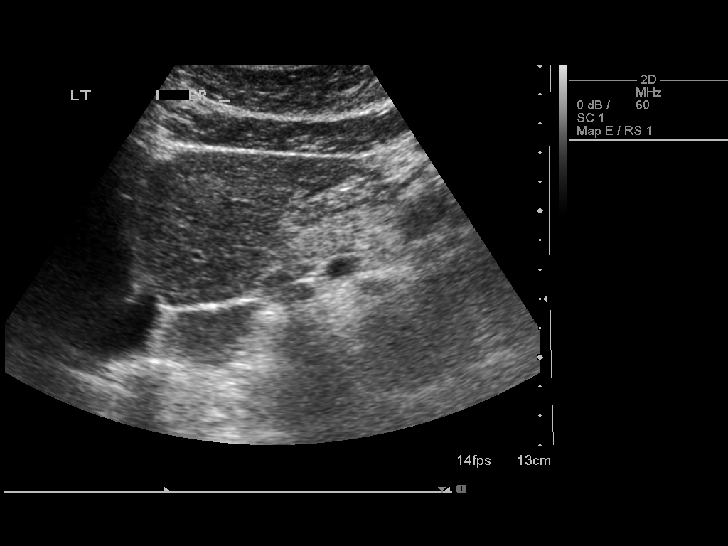
[im 19/76]
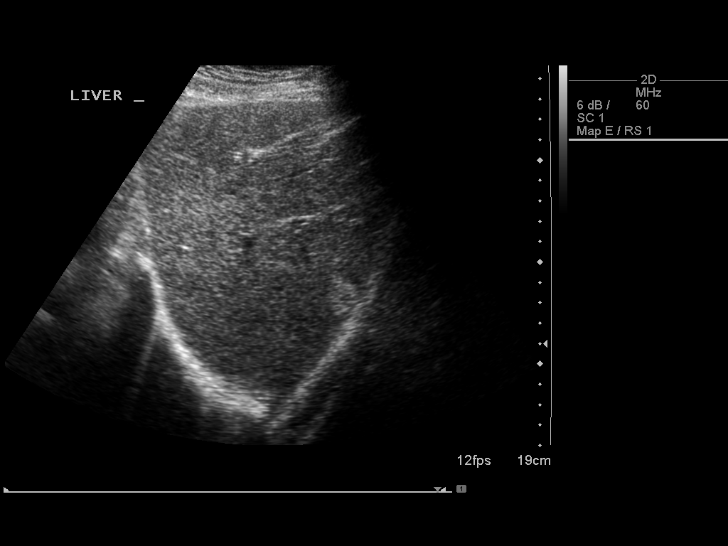
[im 26/76]
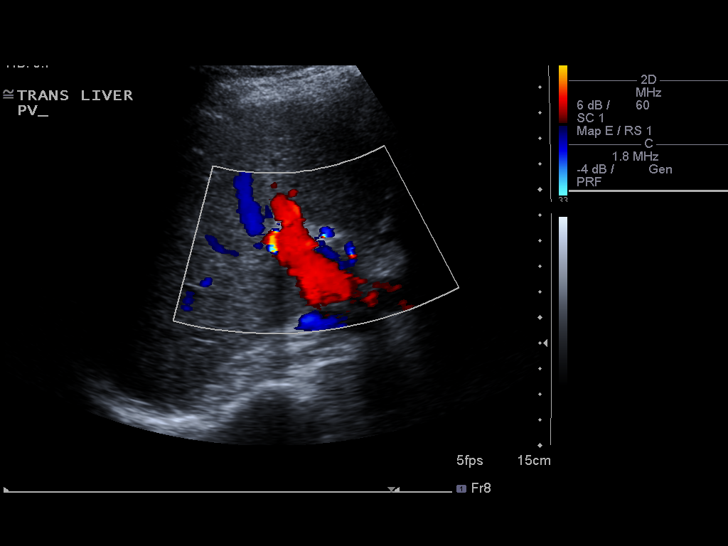
[im 32/76]
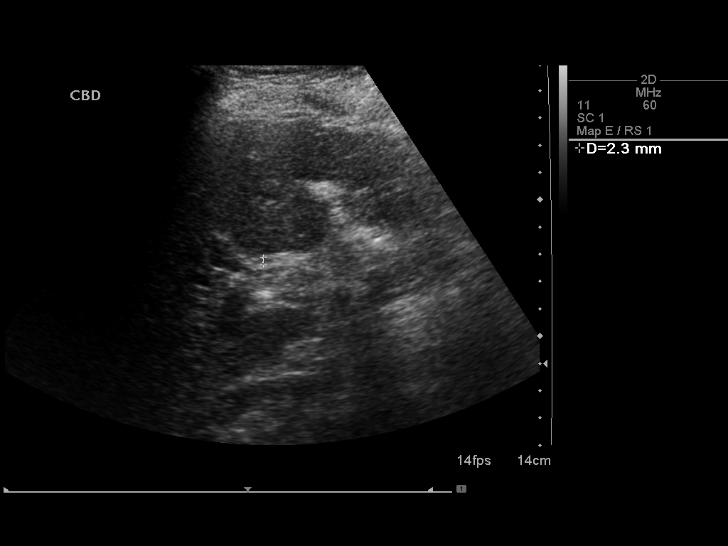
[im 38/76]
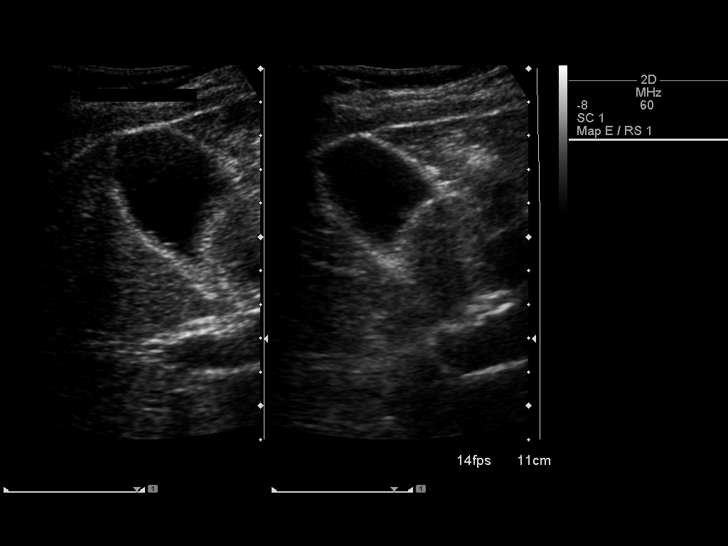
[im 44/76]
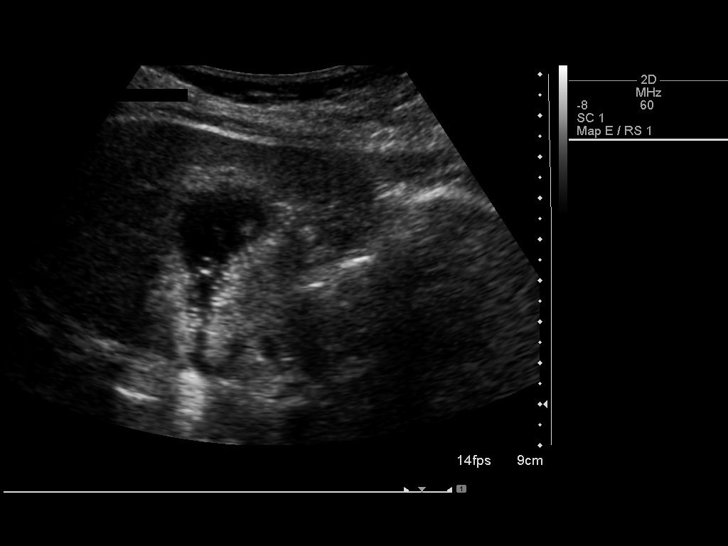
[im 51/76]
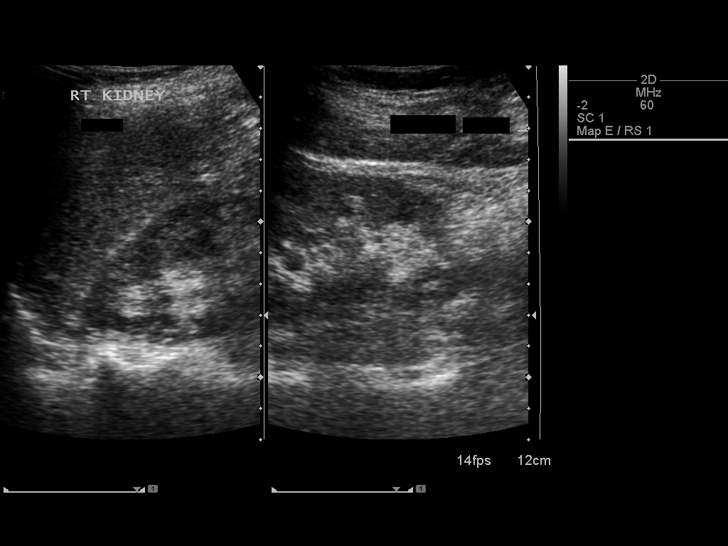
[im 57/76]
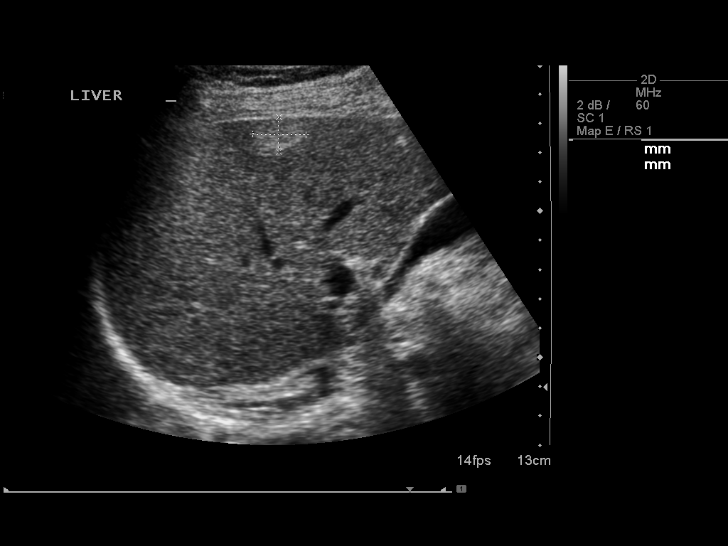
[im 63/76]
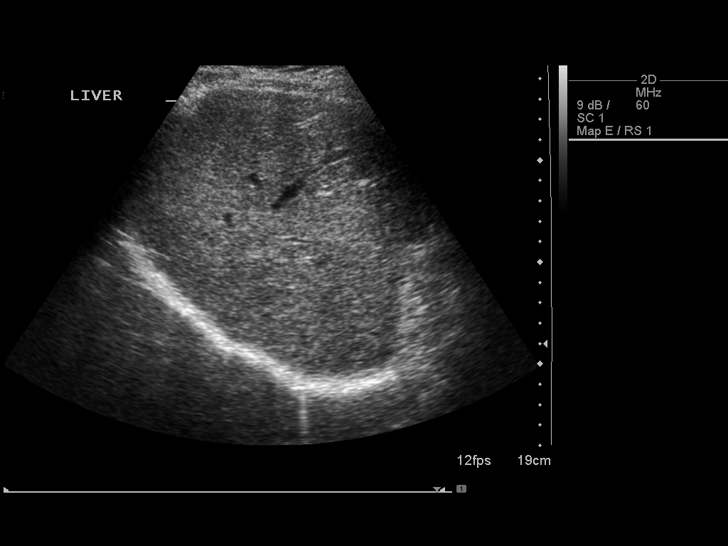
[im 69/76]
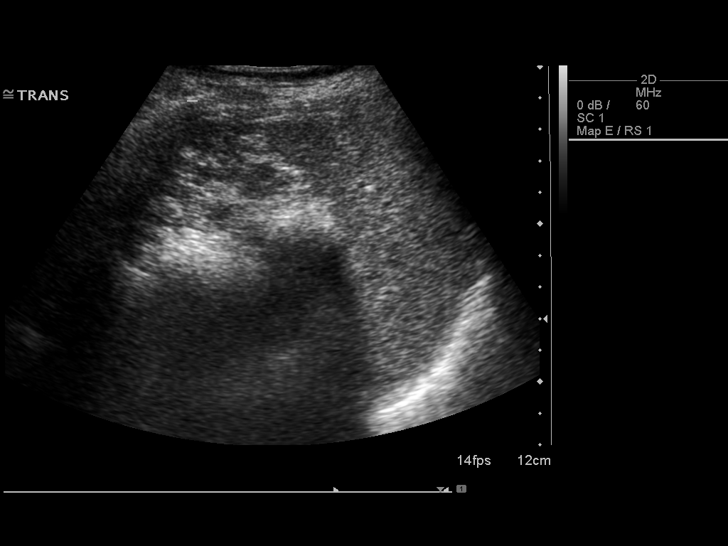
[im 76/76]
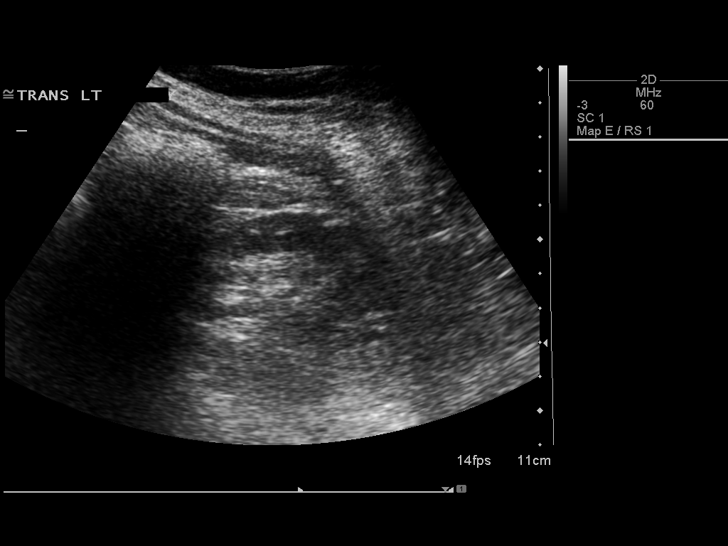

[13 of 25 positions shown; findings below may reference images not displayed]

FINDINGS: Gallbladder:  The gallbladder is well seen.  No definite gallstones
are noted although there does appear to be a gallbladder polyp of
approximately 4 mm present.  Irregularity of the gallbladder wall
may represent adenomyomatosis as noted previously.

Common bile duct:  The common bile duct is normal measuring 2.3 mm
in diameter.

Liver:  The previously noted hemangioma in the right lobe of liver
appears stable measuring 1.8 x 1.2 x 1.8 cm.  No other hepatic
lesion is seen.

IVC:  Visualized.

Pancreas:  The pancreas appears normal.

Spleen:  The spleen is normal in size.

Right Kidney:  No hydronephrosis is seen.  The right kidney
measures 11.1 cm sagittally.

Left Kidney:  No hydronephrosis.  The left kidney measures 11.3 cm.
The left kidney is somewhat difficult to visualize due to adjacent
bowel gas.

Abdominal aorta:  The abdominal aorta is normal in caliber.
IMPRESSION: 1.  Stable liver hemangioma.  No new hepatic abnormality.
2.  No gallstones.  Probable 4 mm gallbladder polyp.  Again
question changes of adenomyomatosis of the gallbladder.

## 2011-03-04 ENCOUNTER — Telehealth: Payer: Self-pay | Admitting: Oncology

## 2011-03-05 ENCOUNTER — Other Ambulatory Visit: Payer: Self-pay | Admitting: Obstetrics and Gynecology

## 2011-03-09 ENCOUNTER — Other Ambulatory Visit: Payer: Medicare Other | Admitting: Lab

## 2011-03-09 ENCOUNTER — Ambulatory Visit: Payer: Medicare Other | Admitting: Oncology

## 2011-04-15 ENCOUNTER — Other Ambulatory Visit (HOSPITAL_COMMUNITY): Payer: Self-pay | Admitting: Obstetrics and Gynecology

## 2011-04-15 DIAGNOSIS — Z1231 Encounter for screening mammogram for malignant neoplasm of breast: Secondary | ICD-10-CM

## 2011-05-18 ENCOUNTER — Encounter: Payer: Self-pay | Admitting: Oncology

## 2011-05-18 ENCOUNTER — Other Ambulatory Visit: Payer: Medicare Other | Admitting: Lab

## 2011-05-18 ENCOUNTER — Ambulatory Visit (HOSPITAL_BASED_OUTPATIENT_CLINIC_OR_DEPARTMENT_OTHER): Payer: Medicare Other | Admitting: Oncology

## 2011-05-18 VITALS — BP 106/59 | HR 77 | Temp 97.6°F | Ht 66.5 in | Wt 154.1 lb

## 2011-05-18 DIAGNOSIS — D509 Iron deficiency anemia, unspecified: Secondary | ICD-10-CM

## 2011-05-18 DIAGNOSIS — R5381 Other malaise: Secondary | ICD-10-CM

## 2011-05-18 DIAGNOSIS — R5383 Other fatigue: Secondary | ICD-10-CM

## 2011-05-18 DIAGNOSIS — Z862 Personal history of diseases of the blood and blood-forming organs and certain disorders involving the immune mechanism: Secondary | ICD-10-CM | POA: Insufficient documentation

## 2011-05-18 DIAGNOSIS — D539 Nutritional anemia, unspecified: Secondary | ICD-10-CM | POA: Insufficient documentation

## 2011-05-18 LAB — IRON AND TIBC
%SAT: 46 % (ref 20–55)
TIBC: 245 ug/dL — ABNORMAL LOW (ref 250–470)
UIBC: 133 ug/dL (ref 125–400)

## 2011-05-18 LAB — COMPREHENSIVE METABOLIC PANEL
ALT: 22 U/L (ref 0–35)
AST: 16 U/L (ref 0–37)
Alkaline Phosphatase: 48 U/L (ref 39–117)
BUN: 12 mg/dL (ref 6–23)
Chloride: 107 mEq/L (ref 96–112)
Creatinine, Ser: 0.96 mg/dL (ref 0.50–1.10)
Total Bilirubin: 0.4 mg/dL (ref 0.3–1.2)

## 2011-05-18 LAB — CBC WITH DIFFERENTIAL/PLATELET
Basophils Absolute: 0 10*3/uL (ref 0.0–0.1)
Eosinophils Absolute: 0.1 10*3/uL (ref 0.0–0.5)
HCT: 44.8 % (ref 34.8–46.6)
HGB: 14.9 g/dL (ref 11.6–15.9)
MCV: 94.2 fL (ref 79.5–101.0)
NEUT#: 4.3 10*3/uL (ref 1.5–6.5)
NEUT%: 66.6 % (ref 38.4–76.8)
RDW: 13.4 % (ref 11.2–14.5)
lymph#: 1.5 10*3/uL (ref 0.9–3.3)

## 2011-05-18 LAB — FERRITIN: Ferritin: 65 ng/mL (ref 10–291)

## 2011-05-18 LAB — VITAMIN B12: Vitamin B-12: 515 pg/mL (ref 211–911)

## 2011-05-18 NOTE — Progress Notes (Signed)
This office note has been dictated.  #[id number]

## 2011-05-18 NOTE — Progress Notes (Signed)
CC:   Thayer Headings, M.D. Azzie Roup, MD Everardo All. Madilyn Fireman, M.D.  HISTORY:  Andrea Mata was seen today for followup of her previous iron-deficiency anemia associated with marked pagophagia.  Andrea Mata was last seen by [place] on 09/08/2010.  Unfortunately, she is feeling more fatigue than usual.  She denies any pagophagia or obvious blood in her stools at the current time.  There is a family history of colon cancer. She last had a colonoscopy on 11/20/2009 by Dr. Dorena Cookey.  The patient has received intravenous iron in March 2008 and again in late June and early July 2011.  I believe stools in the past have been Hemoccult negative.  The patient also has a history of vitamin B12 deficiency and is on oral vitamin B12.  The patient does have symptoms related to fibromyalgia and polymyalgia rheumatica.  PROBLEM LIST: 1. History of iron-deficiency anemia dating back to March 2008,     associated with pagophagia, treated with intravenous iron in March     2008 and most recently, July 2011. 2. History of vitamin B12 deficiency dating back to 1986, currently on     oral vitamin B12. 3. Polymyalgia rheumatica diagnosed 2005. 4. Fibromyalgia diagnosed July 2006. 5. History of adenomatous colonic polyps. 6. Positive family history for colon cancer. 7. GERD. 8. Osteopenia.  MEDICATIONS: 1. Fosamax 70 mg weekly. 2. Vitamin B12 1000 mcg daily. 3. Cymbalta 60 mg twice daily. 4. Premarin 0.625 mg daily for 21 days out of every 28 days. 5. Prilosec 40 mg daily. 6. Qsymia 3.75 mg-23 mg daily. 7. Prednisone 5 mg daily. 8. Desyrel 50 mg at bedtime.  PHYSICAL EXAMINATION:  General:  [name] looks well.  Weight is stable at 154 pounds, height 5 feet 6-1/2 inches, body surface area 1.81 sq m.  Vital Signs:  Blood pressure today 106/59.  Other vital signs are normal.  HEENT:  There is no scleral icterus.  Mouth and pharynx are benign.  Lymphatic:  There is no peripheral adenopathy  palpable. Heart/Lungs:  Normal.  Breasts:  Not examined.  The patient is due for mammograms within the next week or two.  Abdomen:  Benign with no organomegaly or masses palpable.  Extremities:  No peripheral edema or clubbing.  Neurologic:  Exam is grossly normal.  LABORATORY DATA:  Today, white count 6.4, ANC 4.3, hemoglobin 14.9, hematocrit 44.8, platelets 236,000.  Chemistries, iron studies and vitamin B12 level are pending today.  Chemistries from 09/08/2010 were entirely normal.  Iron studies from 12/08/2010:  Iron saturation was 43%, ferritin 133, vitamin B12 level was 474.  On 09/08/2010, iron saturation was 40%, ferritin 178, and vitamin B12 level 451. Sedimentation rate on 09/08/2010 was 4.  Sedimentation rate on 04/29/2010 was 1.  IMAGING: 1. The patient has been getting mammograms at [facility] and is scheduled     to have mammograms in the next week or two at [facility]. 2. Ultrasound of the abdomen on 11/20/2008 showed stable liver     hemangioma in the right lobe measuring 1.8 x 1.2 x 1.8 cm.  No new     lesions were seen.  No gallstones.  There was a probable 4 mm     gallbladder polyp with question of adenomyosis of the gallbladder.  IMPRESSION AND PLAN:  Andrea Mata continues to do well at the present time.  She will continue on her current medicines.  Unfortunately, [name] tells me that she has been feeling more fatigued lately than she has  in a long time.  Reasons for this are not clear.  We talked about sleep habits and stress.  She is taking care of her 79 year old mother and that is at least 5 days out of the week that she attends to her mother.  As stated above, the patient's last colonoscopy with Dr. Madilyn Fireman was on November 20, 2009, and family history is positive for colon cancer.    ______________________________ Samul Dada, M.D. DSM/MEDQ  D:  05/18/2011  T:  05/18/2011  Job:  [id number]

## 2011-05-20 ENCOUNTER — Other Ambulatory Visit: Payer: Self-pay | Admitting: Oncology

## 2011-05-20 ENCOUNTER — Encounter: Payer: Self-pay | Admitting: Oncology

## 2011-05-20 ENCOUNTER — Ambulatory Visit (HOSPITAL_COMMUNITY)
Admission: RE | Admit: 2011-05-20 | Discharge: 2011-05-20 | Disposition: A | Discharge status: Home / Self Care | Payer: Medicare Other | Source: Ambulatory Visit | Attending: Obstetrics and Gynecology | Admitting: Obstetrics and Gynecology

## 2011-05-20 DIAGNOSIS — Z1231 Encounter for screening mammogram for malignant neoplasm of breast: Secondary | ICD-10-CM

## 2011-05-20 NOTE — Progress Notes (Signed)
Ferritin on 1/21 was 65, down from 133 on 8/13.  Pt had received 3 doses of IV Feraheme in late June 2011.  We are giving pt 3 doses of IV Feraheme over the next couple of weeks.

## 2011-05-21 ENCOUNTER — Telehealth: Payer: Self-pay | Admitting: Oncology

## 2011-05-21 ENCOUNTER — Telehealth: Payer: Self-pay

## 2011-05-21 NOTE — Telephone Encounter (Signed)
2/1 2/8 2/15 appts/times givne to pt as other dates were not good aom

## 2011-05-21 NOTE — Telephone Encounter (Signed)
Pt had called at 1111AM asking what her ferritin level was. LVM that ferritin was 65.

## 2011-05-21 NOTE — Telephone Encounter (Signed)
lm with 1/29 2/5 and 2/12 appts and times and to call with any questions     aom

## 2011-05-26 ENCOUNTER — Ambulatory Visit: Payer: Medicare Other

## 2011-05-29 ENCOUNTER — Ambulatory Visit (HOSPITAL_BASED_OUTPATIENT_CLINIC_OR_DEPARTMENT_OTHER): Payer: Medicare Other

## 2011-05-29 VITALS — BP 123/70 | HR 81 | Temp 97.0°F

## 2011-05-29 DIAGNOSIS — Z862 Personal history of diseases of the blood and blood-forming organs and certain disorders involving the immune mechanism: Secondary | ICD-10-CM

## 2011-05-29 MED ORDER — FERUMOXYTOL INJECTION 510 MG/17 ML
510.0000 mg | Freq: Once | INTRAVENOUS | Status: AC
  Administered 2011-05-29: 510 mg via INTRAVENOUS
  Filled 2011-05-29: qty 17

## 2011-05-29 MED ORDER — SODIUM CHLORIDE 0.9 % IV SOLN
Freq: Once | INTRAVENOUS | Status: AC
  Administered 2011-05-29: 11:00:00 via INTRAVENOUS

## 2011-06-02 ENCOUNTER — Ambulatory Visit: Payer: Medicare Other

## 2011-06-05 ENCOUNTER — Ambulatory Visit (HOSPITAL_BASED_OUTPATIENT_CLINIC_OR_DEPARTMENT_OTHER): Payer: Medicare Other

## 2011-06-05 VITALS — BP 111/63 | HR 85 | Temp 97.0°F

## 2011-06-05 DIAGNOSIS — Z862 Personal history of diseases of the blood and blood-forming organs and certain disorders involving the immune mechanism: Secondary | ICD-10-CM

## 2011-06-05 MED ORDER — SODIUM CHLORIDE 0.9 % IV SOLN
Freq: Once | INTRAVENOUS | Status: AC
  Administered 2011-06-05: 12:00:00 via INTRAVENOUS

## 2011-06-05 MED ORDER — FERUMOXYTOL INJECTION 510 MG/17 ML
510.0000 mg | Freq: Once | INTRAVENOUS | Status: AC
  Administered 2011-06-05: 510 mg via INTRAVENOUS
  Filled 2011-06-05: qty 17

## 2011-06-05 NOTE — Patient Instructions (Signed)
Patient aware of next appointment; discharged home with no complaints. 

## 2011-06-09 ENCOUNTER — Ambulatory Visit: Payer: Medicare Other

## 2011-06-12 ENCOUNTER — Ambulatory Visit (HOSPITAL_BASED_OUTPATIENT_CLINIC_OR_DEPARTMENT_OTHER): Payer: Medicare Other

## 2011-06-12 VITALS — BP 110/62 | HR 88

## 2011-06-12 DIAGNOSIS — D509 Iron deficiency anemia, unspecified: Secondary | ICD-10-CM

## 2011-06-12 DIAGNOSIS — Z862 Personal history of diseases of the blood and blood-forming organs and certain disorders involving the immune mechanism: Secondary | ICD-10-CM

## 2011-06-12 MED ORDER — FERUMOXYTOL INJECTION 510 MG/17 ML
510.0000 mg | Freq: Once | INTRAVENOUS | Status: AC
  Administered 2011-06-12: 510 mg via INTRAVENOUS
  Filled 2011-06-12: qty 17

## 2011-06-12 MED ORDER — SODIUM CHLORIDE 0.9 % IV SOLN
Freq: Once | INTRAVENOUS | Status: AC
  Administered 2011-06-12: 11:00:00 via INTRAVENOUS

## 2011-06-12 NOTE — Patient Instructions (Signed)
Pt was asking about f/u lab work. I stated I will notify Dr Arline Asp about this. Pt instructed to call if any problems or fatigue worsens.

## 2011-06-15 ENCOUNTER — Other Ambulatory Visit: Payer: Self-pay | Admitting: Oncology

## 2011-06-15 DIAGNOSIS — Z862 Personal history of diseases of the blood and blood-forming organs and certain disorders involving the immune mechanism: Secondary | ICD-10-CM

## 2011-06-16 ENCOUNTER — Telehealth: Payer: Self-pay | Admitting: Oncology

## 2011-06-16 ENCOUNTER — Telehealth: Payer: Self-pay | Admitting: Medical Oncology

## 2011-06-16 DIAGNOSIS — Z862 Personal history of diseases of the blood and blood-forming organs and certain disorders involving the immune mechanism: Secondary | ICD-10-CM

## 2011-06-16 DIAGNOSIS — D539 Nutritional anemia, unspecified: Secondary | ICD-10-CM

## 2011-06-16 NOTE — Telephone Encounter (Signed)
l/m with 8/19 lab appt   aom

## 2011-06-16 NOTE — Telephone Encounter (Signed)
I called pt to ask if got any stool cards to check for bleeding. She states no. I explained since her ferritin had dropped Dr. Arline Asp would like to check stool just to be sure she is not bleeding. Pt states that she has done these in the past. I will mail to pt and she can bring back or mail back. She voiced understanding.

## 2011-07-10 ENCOUNTER — Ambulatory Visit (HOSPITAL_BASED_OUTPATIENT_CLINIC_OR_DEPARTMENT_OTHER): Payer: Medicare Other

## 2011-07-10 ENCOUNTER — Other Ambulatory Visit: Payer: Self-pay | Admitting: Oncology

## 2011-07-10 DIAGNOSIS — Z862 Personal history of diseases of the blood and blood-forming organs and certain disorders involving the immune mechanism: Secondary | ICD-10-CM

## 2011-07-10 DIAGNOSIS — D649 Anemia, unspecified: Secondary | ICD-10-CM

## 2011-07-10 DIAGNOSIS — D539 Nutritional anemia, unspecified: Secondary | ICD-10-CM

## 2011-07-10 DIAGNOSIS — D509 Iron deficiency anemia, unspecified: Secondary | ICD-10-CM

## 2011-08-18 ENCOUNTER — Other Ambulatory Visit: Payer: Self-pay | Admitting: Internal Medicine

## 2011-08-18 DIAGNOSIS — N63 Unspecified lump in unspecified breast: Secondary | ICD-10-CM

## 2011-08-18 DIAGNOSIS — N644 Mastodynia: Secondary | ICD-10-CM

## 2011-08-20 ENCOUNTER — Other Ambulatory Visit: Payer: Medicare Other

## 2011-08-21 ENCOUNTER — Ambulatory Visit
Admission: RE | Admit: 2011-08-21 | Discharge: 2011-08-21 | Disposition: A | Discharge status: Home / Self Care | Payer: Medicare Other | Source: Ambulatory Visit | Attending: Internal Medicine | Admitting: Internal Medicine

## 2011-08-21 DIAGNOSIS — N63 Unspecified lump in unspecified breast: Secondary | ICD-10-CM

## 2011-08-21 DIAGNOSIS — N644 Mastodynia: Secondary | ICD-10-CM

## 2011-12-14 ENCOUNTER — Other Ambulatory Visit: Payer: Medicare Other

## 2012-05-05 ENCOUNTER — Other Ambulatory Visit (HOSPITAL_COMMUNITY): Payer: Self-pay | Admitting: Internal Medicine

## 2012-05-05 DIAGNOSIS — Z1231 Encounter for screening mammogram for malignant neoplasm of breast: Secondary | ICD-10-CM

## 2012-05-09 ENCOUNTER — Telehealth: Payer: Self-pay | Admitting: Oncology

## 2012-05-09 NOTE — Telephone Encounter (Signed)
pt called and left message to c.b as she needed to r/s her appt

## 2012-05-10 ENCOUNTER — Telehealth: Payer: Self-pay | Admitting: Oncology

## 2012-05-10 NOTE — Telephone Encounter (Signed)
pt called in to cx her lab and visit as she feels that her pcp can handle any of her needs .      anne

## 2012-05-12 ENCOUNTER — Other Ambulatory Visit: Payer: Medicare Other | Admitting: Lab

## 2012-05-17 ENCOUNTER — Ambulatory Visit: Payer: Medicare Other | Admitting: Oncology

## 2012-05-17 ENCOUNTER — Ambulatory Visit: Payer: Medicare Other | Admitting: Family

## 2012-06-02 ENCOUNTER — Ambulatory Visit (HOSPITAL_COMMUNITY): Payer: Medicare Other

## 2012-06-30 ENCOUNTER — Ambulatory Visit (HOSPITAL_COMMUNITY): Payer: Medicare Other

## 2012-07-25 ENCOUNTER — Ambulatory Visit
Admission: RE | Admit: 2012-07-25 | Discharge: 2012-07-25 | Disposition: A | Discharge status: Home / Self Care | Payer: Medicare Other | Source: Ambulatory Visit | Attending: Internal Medicine | Admitting: Internal Medicine

## 2012-07-25 DIAGNOSIS — Z1231 Encounter for screening mammogram for malignant neoplasm of breast: Secondary | ICD-10-CM

## 2012-09-02 ENCOUNTER — Encounter: Payer: Self-pay | Admitting: Cardiovascular Disease

## 2012-09-02 ENCOUNTER — Encounter: Payer: Self-pay | Admitting: *Deleted

## 2012-09-06 ENCOUNTER — Encounter (INDEPENDENT_AMBULATORY_CARE_PROVIDER_SITE_OTHER): Payer: Medicare Other

## 2012-09-06 DIAGNOSIS — R079 Chest pain, unspecified: Secondary | ICD-10-CM

## 2012-09-06 DIAGNOSIS — R0602 Shortness of breath: Secondary | ICD-10-CM

## 2012-09-13 ENCOUNTER — Telehealth: Payer: Self-pay | Admitting: *Deleted

## 2012-09-13 NOTE — Telephone Encounter (Signed)
Normal MET test results left on Pt. Answering maching

## 2013-04-14 ENCOUNTER — Other Ambulatory Visit: Payer: Self-pay | Admitting: Obstetrics and Gynecology

## 2013-08-04 ENCOUNTER — Other Ambulatory Visit: Payer: Self-pay

## 2013-08-04 DIAGNOSIS — Z1231 Encounter for screening mammogram for malignant neoplasm of breast: Secondary | ICD-10-CM

## 2013-08-22 ENCOUNTER — Encounter (INDEPENDENT_AMBULATORY_CARE_PROVIDER_SITE_OTHER): Payer: Self-pay

## 2013-08-22 ENCOUNTER — Ambulatory Visit
Admission: RE | Admit: 2013-08-22 | Discharge: 2013-08-22 | Disposition: A | Discharge status: Home / Self Care | Payer: Medicare Other | Source: Ambulatory Visit

## 2013-08-22 DIAGNOSIS — Z1231 Encounter for screening mammogram for malignant neoplasm of breast: Secondary | ICD-10-CM

## 2014-08-14 ENCOUNTER — Other Ambulatory Visit (HOSPITAL_COMMUNITY): Payer: Self-pay | Admitting: Internal Medicine

## 2014-08-14 DIAGNOSIS — I779 Disorder of arteries and arterioles, unspecified: Secondary | ICD-10-CM

## 2014-08-21 ENCOUNTER — Other Ambulatory Visit: Payer: Self-pay

## 2014-08-21 DIAGNOSIS — Z1231 Encounter for screening mammogram for malignant neoplasm of breast: Secondary | ICD-10-CM

## 2014-08-22 ENCOUNTER — Ambulatory Visit (HOSPITAL_COMMUNITY)
Admission: RE | Admit: 2014-08-22 | Discharge: 2014-08-22 | Disposition: A | Discharge status: Home / Self Care | Payer: Medicare Other | Source: Ambulatory Visit | Attending: Cardiology | Admitting: Cardiology

## 2014-08-22 DIAGNOSIS — I779 Disorder of arteries and arterioles, unspecified: Secondary | ICD-10-CM

## 2014-08-22 NOTE — Progress Notes (Signed)
Carotid duplex completed. No evidence for significant stenosis. Brianna L Mazza,RVT

## 2014-08-31 ENCOUNTER — Telehealth (HOSPITAL_COMMUNITY): Payer: Self-pay | Admitting: *Deleted

## 2014-09-10 ENCOUNTER — Ambulatory Visit
Admission: RE | Admit: 2014-09-10 | Discharge: 2014-09-10 | Disposition: A | Discharge status: Home / Self Care | Payer: Medicare Other | Source: Ambulatory Visit

## 2014-09-10 ENCOUNTER — Ambulatory Visit: Payer: Self-pay

## 2014-09-10 DIAGNOSIS — Z1231 Encounter for screening mammogram for malignant neoplasm of breast: Secondary | ICD-10-CM

## 2014-09-12 ENCOUNTER — Other Ambulatory Visit: Payer: Self-pay | Admitting: Internal Medicine

## 2014-09-12 DIAGNOSIS — R928 Other abnormal and inconclusive findings on diagnostic imaging of breast: Secondary | ICD-10-CM

## 2014-09-19 ENCOUNTER — Ambulatory Visit
Admission: RE | Admit: 2014-09-19 | Discharge: 2014-09-19 | Disposition: A | Discharge status: Home / Self Care | Payer: Medicare Other | Source: Ambulatory Visit | Attending: Internal Medicine | Admitting: Internal Medicine

## 2014-09-19 DIAGNOSIS — R928 Other abnormal and inconclusive findings on diagnostic imaging of breast: Secondary | ICD-10-CM

## 2015-02-07 ENCOUNTER — Other Ambulatory Visit: Payer: Self-pay | Admitting: Gastroenterology

## 2015-09-27 ENCOUNTER — Other Ambulatory Visit: Payer: Self-pay | Admitting: Internal Medicine

## 2015-09-27 DIAGNOSIS — Z1231 Encounter for screening mammogram for malignant neoplasm of breast: Secondary | ICD-10-CM

## 2015-10-14 ENCOUNTER — Ambulatory Visit
Admission: RE | Admit: 2015-10-14 | Discharge: 2015-10-14 | Disposition: A | Discharge status: Home / Self Care | Payer: Medicare Other | Source: Ambulatory Visit | Attending: Internal Medicine | Admitting: Internal Medicine

## 2015-10-14 DIAGNOSIS — Z1231 Encounter for screening mammogram for malignant neoplasm of breast: Secondary | ICD-10-CM

## 2016-06-26 HISTORY — PX: TRANSTHORACIC ECHOCARDIOGRAM: SHX275

## 2016-06-30 ENCOUNTER — Ambulatory Visit (INDEPENDENT_AMBULATORY_CARE_PROVIDER_SITE_OTHER): Payer: Medicare Other

## 2016-06-30 ENCOUNTER — Encounter (INDEPENDENT_AMBULATORY_CARE_PROVIDER_SITE_OTHER): Payer: Self-pay | Admitting: Orthopaedic Surgery

## 2016-06-30 ENCOUNTER — Ambulatory Visit (INDEPENDENT_AMBULATORY_CARE_PROVIDER_SITE_OTHER): Payer: Medicare Other | Admitting: Orthopaedic Surgery

## 2016-06-30 DIAGNOSIS — M25521 Pain in right elbow: Secondary | ICD-10-CM | POA: Diagnosis not present

## 2016-06-30 NOTE — Progress Notes (Signed)
Office Visit Note   Patient: Andrea Mata           Date of Birth: Nov 01, 1945           MRN: [id number] Visit Date: 06/30/2016              Requested by: Thayer HeadingsBrian Mackenzie, MD [address], [address] [place], [place] [id number] PCP: Thayer HeadingsMACKENZIE,BRIAN, MD   Assessment & Plan: Visit Diagnoses:  1. Pain in right elbow     Plan: Patient has a spontaneous forearm hematoma. I do not appreciate any signs or symptoms of infection or worrisome features. Ace wrap was applied for compression. I recommend warm compresses. I think that she can safely discontinue taking the antibiotic and the prednisone.  Follow-Up Instructions: Return if symptoms worsen or fail to improve.   Orders:  Orders Placed This Encounter  Procedures  . XR Elbow Complete Right (3+View)   No orders of the defined types were placed in this encounter.     Procedures: No procedures performed   Clinical Data: No additional findings.   Subjective: Chief Complaint  Patient presents with  . Right Elbow - Pain    Patient is a 71 year old female comes in with right elbow swelling. She was seen by the rheumatologist and placed on Augmentin. She was given for a referral here for further evaluation. She denies any fevers or chills or injuries. She is not on any blood thinners. She denies any constitutional symptoms. Ultrasound was done which show fluid collection of her proximal forearm.    Review of Systems  Constitutional: Negative.   HENT: Negative.   Eyes: Negative.   Respiratory: Negative.   Cardiovascular: Negative.   Endocrine: Negative.   Musculoskeletal: Negative.   Neurological: Negative.   Hematological: Negative.   Psychiatric/Behavioral: Negative.   All other systems reviewed and are negative.    Objective: Vital Signs: There were no vitals taken for this visit.  Physical Exam  Constitutional: She is oriented to person, place, and time. She appears well-developed and well-nourished.    HENT:  Head: Normocephalic and atraumatic.  Eyes: EOM are normal.  Neck: Neck supple.  Pulmonary/Chest: Effort normal.  Abdominal: Soft.  Neurological: She is alert and oriented to person, place, and time.  Skin: Skin is warm. Capillary refill takes less than 2 seconds.  Psychiatric: She has a normal mood and affect. Her behavior is normal. Judgment and thought content normal.  Nursing note and vitals reviewed.   Ortho Exam  exam of the right elbow shows large hematoma and ecchymosis with moderate swelling. She has full range of motion of the elbow and full pronation supination all without pain. She's neurovascularly intact.  Specialty Comments:  No specialty comments available.  Imaging: Xr Elbow Complete Right (3+view)  Result Date: 06/30/2016 Soft tissue swelling over the proximal ulna. There is no acute or structural findings or abnormalities    PMFS History: Patient Active Problem List   Diagnosis Date Noted  . H/O: iron deficiency anemia 05/18/2011  . Unspecified deficiency anemia 05/18/2011   Past Medical History:  Diagnosis Date  . Anemia   . Chest pain   . Constipation   . Fibromyalgia 2006  . GERD (gastroesophageal reflux disease)   . History of shingles    mid thoracic extending to front of right side of chest  . Osteopenia   . Pneumonia   . Polymyalgia rheumatica (HCC) 2005    Family History  Problem Relation Age of Onset  . Colon  cancer Mother   . Colon cancer Father     Past Surgical History:  Procedure Laterality Date  . ABDOMINAL HYSTERECTOMY  1982  . adenomatous polyps     Social History   Occupational History  . Not on file.   Social History Main Topics  . Smoking status: Never Smoker  . Smokeless tobacco: Never Used  . Alcohol use Yes     Comment: occasionally  . Drug use: No  . Sexual activity: Not on file

## 2016-07-14 ENCOUNTER — Ambulatory Visit (INDEPENDENT_AMBULATORY_CARE_PROVIDER_SITE_OTHER): Payer: Medicare Other | Admitting: Orthopaedic Surgery

## 2016-09-03 ENCOUNTER — Other Ambulatory Visit: Payer: Self-pay | Admitting: Internal Medicine

## 2016-09-04 ENCOUNTER — Other Ambulatory Visit: Payer: Self-pay | Admitting: Internal Medicine

## 2016-09-04 DIAGNOSIS — R911 Solitary pulmonary nodule: Secondary | ICD-10-CM

## 2016-09-04 DIAGNOSIS — R918 Other nonspecific abnormal finding of lung field: Secondary | ICD-10-CM

## 2016-09-07 ENCOUNTER — Ambulatory Visit
Admission: RE | Admit: 2016-09-07 | Discharge: 2016-09-07 | Disposition: A | Discharge status: Home / Self Care | Payer: Medicare Other | Source: Ambulatory Visit | Attending: Internal Medicine | Admitting: Internal Medicine

## 2016-09-07 DIAGNOSIS — R911 Solitary pulmonary nodule: Secondary | ICD-10-CM

## 2016-09-24 ENCOUNTER — Other Ambulatory Visit: Payer: Self-pay | Admitting: Gastroenterology

## 2016-09-24 DIAGNOSIS — K7689 Other specified diseases of liver: Secondary | ICD-10-CM

## 2016-09-30 ENCOUNTER — Encounter: Payer: Medicare Other | Admitting: Surgery

## 2016-10-09 ENCOUNTER — Ambulatory Visit
Admission: RE | Admit: 2016-10-09 | Discharge: 2016-10-09 | Disposition: A | Discharge status: Home / Self Care | Payer: Medicare Other | Source: Ambulatory Visit | Attending: Gastroenterology | Admitting: Gastroenterology

## 2016-10-09 DIAGNOSIS — K7689 Other specified diseases of liver: Secondary | ICD-10-CM

## 2016-10-09 MED ORDER — GADOBENATE DIMEGLUMINE 529 MG/ML IV SOLN
15.0000 mL | Freq: Once | INTRAVENOUS | Status: AC | PRN
  Administered 2016-10-09: 15 mL via INTRAVENOUS

## 2016-10-22 ENCOUNTER — Encounter: Payer: Medicare Other | Admitting: Surgery

## 2016-11-09 ENCOUNTER — Other Ambulatory Visit: Payer: Self-pay | Admitting: Internal Medicine

## 2016-11-09 DIAGNOSIS — Z1231 Encounter for screening mammogram for malignant neoplasm of breast: Secondary | ICD-10-CM

## 2016-11-11 ENCOUNTER — Encounter: Payer: Self-pay | Admitting: Surgery

## 2016-11-11 ENCOUNTER — Institutional Professional Consult (permissible substitution) (INDEPENDENT_AMBULATORY_CARE_PROVIDER_SITE_OTHER): Payer: Medicare Other | Admitting: Surgery

## 2016-11-11 VITALS — BP 118/72 | HR 80 | Resp 20 | Ht 66.0 in | Wt 159.0 lb

## 2016-11-11 DIAGNOSIS — I712 Thoracic aortic aneurysm, without rupture: Secondary | ICD-10-CM | POA: Diagnosis not present

## 2016-11-11 DIAGNOSIS — I7121 Aneurysm of the ascending aorta, without rupture: Secondary | ICD-10-CM

## 2016-11-16 ENCOUNTER — Encounter: Payer: Self-pay | Admitting: Surgery

## 2016-11-16 NOTE — Progress Notes (Signed)
Cardiothoracic Surgery Consultation   PCP is Merri BrunettePharr, Walter, MD Referring Provider is Merri BrunettePharr, Walter, MD  Chief Complaint  Patient presents with  . Thoracic Aortic Aneurysm    Surgical eval, CT Chest 09/07/16    HPI:  The patient is a 71 year old woman with fibromyalgia and polymyalgia rheumatica who had a cervical spine x-ray due to some neck pain and there was a questionable nodule in the right lung apex. A CT scan of the chest was obtained and showed some scarring in the right lung apex accounting for the abnormality seen on the x-ray and also showed a 4.2 cm fusiform ascending aortic aneurysm. She has no family history of aneurysm or aortic dissection and no hx of connective tissue disease.   Past Medical History:  Diagnosis Date  . Anemia   . Chest pain   . Constipation   . Fibromyalgia 2006  . GERD (gastroesophageal reflux disease)   . History of shingles    mid thoracic extending to front of right side of chest  . Hyperlipidemia   . Osteopenia   . Pneumonia   . Polymyalgia rheumatica (HCC) 2005    Past Surgical History:  Procedure Laterality Date  . ABDOMINAL HYSTERECTOMY  1982  . adenomatous polyps      Family History  Problem Relation Age of Onset  . Colon cancer Mother   . Colon cancer Father     Social History Social History  Substance Use Topics  . Smoking status: Never Smoker  . Smokeless tobacco: Never Used  . Alcohol use Yes     Comment: occasionally    Current Outpatient Prescriptions  Medication Sig Dispense Refill  . cyanocobalamin (,VITAMIN B-12,) 1000 MCG/ML injection INJECT 1ML IM ONCE MONTHLY  1  . cyanocobalamin 1000 MCG tablet Take 100 mcg by mouth daily.      . cyclobenzaprine (FLEXERIL) 5 MG tablet TAKE 1 TABLET BY MOUTH ONCE A DAY AS NEEDED  0  . DULoxetine (CYMBALTA) 60 MG capsule Take 60 mg by mouth daily.     Marland Kitchen. LYRICA 100 MG capsule Take 100 mg by mouth 2 (two) times daily.  1  . predniSONE (DELTASONE) 5 MG tablet Take 5 mg  by mouth daily.      . pregabalin (LYRICA) 50 MG capsule Take 50 mg by mouth 2 (two) times daily.     . traZODone (DESYREL) 50 MG tablet Take 50 mg by mouth at bedtime.      . triamcinolone ointment (KENALOG) 0.5 % APPLY TO AFFECTED AREA TWICE A DAY AS NEEDED  0   No current facility-administered medications for this visit.     No Known Allergies  Review of Systems  Constitutional: Positive for fatigue. Negative for chills and fever.       Weight gain  HENT: Negative.   Eyes: Negative.   Respiratory: Positive for cough.   Cardiovascular: Positive for leg swelling. Negative for chest pain.  Gastrointestinal: Positive for constipation.  Endocrine: Negative.   Genitourinary: Positive for frequency.  Musculoskeletal: Positive for myalgias.  Skin: Negative.   Allergic/Immunologic: Negative.   Neurological: Negative.        Memory problems  Hematological: Bruises/bleeds easily.  Psychiatric/Behavioral: Negative.     BP 118/72   Pulse 80   Resp 20   Ht 5\' 6"  (1.676 m)   Wt 159 lb (72.1 kg)   SpO2 97% Comment: RA  BMI 25.66 kg/m  Physical Exam  Constitutional: She is oriented to person,  place, and time. She appears well-developed and well-nourished. No distress.  HENT:  Head: Normocephalic and atraumatic.  Mouth/Throat: Oropharynx is clear and moist.  Eyes: Pupils are equal, round, and reactive to light. Conjunctivae and EOM are normal.  Neck: Normal range of motion. Neck supple. No JVD present. No thyromegaly present.  Cardiovascular: Normal rate, regular rhythm, normal heart sounds and intact distal pulses.   No murmur heard. Pulmonary/Chest: Effort normal and breath sounds normal. No respiratory distress. She has no wheezes. She has no rales. She exhibits no tenderness.  Abdominal: Soft. Bowel sounds are normal. She exhibits no distension and no mass. There is no tenderness.  Musculoskeletal: Normal range of motion. She exhibits no edema.  Lymphadenopathy:    She has no  cervical adenopathy.  Neurological: She is alert and oriented to person, place, and time. She has normal strength. No cranial nerve deficit or sensory deficit.  Skin: Skin is warm and dry.  Psychiatric: She has a normal mood and affect.    Diagnostic Tests:   Show images for CT CHEST WO CONTRAST  Addendum   ADDENDUM REPORT: 09/16/2016 10:24  ADDENDUM: Not mentioned in the impression:  3. There is a 14 mm hypodense right hepatic mass of uncertain etiology peripherally located. Further evaluation with an MRI of the abdomen is recommended for characterization.   Electronically Signed   By: Elige Ko   On: 09/16/2016 10:24   Addended by Joellyn Haff, MD on 09/16/2016 10:27 AM    Study Result   CLINICAL DATA:  Abnormality at the right lung apex 06/18/2016 on cervical spine x-ray.  EXAM: CT CHEST WITHOUT CONTRAST  TECHNIQUE: Multidetector CT imaging of the chest was performed following the standard protocol without IV contrast.  COMPARISON:  Cervical spine x-ray 06/18/2016  FINDINGS: Cardiovascular: No significant vascular findings. Normal heart size. Ascending thoracic aortic aneurysm measuring 4.2 cm in diameter at the level of the right main pulmonary artery. No pericardial effusion.  Mediastinum/Nodes: No enlarged mediastinal or axillary lymph nodes. Thyroid gland, trachea, and esophagus demonstrate no significant findings.  Lungs/Pleura: No focal consolidation, pleural effusion or pneumothorax. Right apical scarring likely accounting for the abnormality seen on cervical spine x-ray. Mild lingular scarring.  Upper Abdomen: No acute upper abdominal abnormality. 14 mm hypodense right hepatic mass of uncertain etiology peripherally located.  Musculoskeletal: No chest wall mass or suspicious bone lesions identified.  IMPRESSION: 1. Mild right apical scarring likely accounting for the abnormality seen on cervical spine x-ray. 2. Ascending  thoracic aortic aneurysm measuring 4.2 cm. Recommend annual imaging followup by CTA or MRA. This recommendation follows 2010 ACCF/AHA/AATS/ACR/ASA/SCA/SCAI/SIR/STS/SVM Guidelines for the Diagnosis and Management of Patients with Thoracic Aortic Disease. Circulation. 2010; 121: [id number]  Electronically Signed: By: Elige Ko On: 09/07/2016 09:59       Impression:  She has a 4.2 cm fusiform ascending aortic aneurysm found incidentally on chest CT. It is unknown how long this has been present. It is well below the 5.5 cm threshold for surgical treatment but should be followed up in one year to establish stability. I reviewed the CT scan with her and answered her questions. Her BP is under good control.  Plan:  I will see her back in one year for a CTA of the chest.  I spent 30 minutes performing this consultation and > 50% of this time was spent face to face counseling and coordinating the care of this patient's ascending aortic aneurysm   Alleen Borne, MD [facility] [phone]

## 2016-11-17 ENCOUNTER — Ambulatory Visit
Admission: RE | Admit: 2016-11-17 | Discharge: 2016-11-17 | Disposition: A | Discharge status: Home / Self Care | Payer: Medicare Other | Source: Ambulatory Visit | Attending: Internal Medicine | Admitting: Internal Medicine

## 2016-11-17 DIAGNOSIS — Z1231 Encounter for screening mammogram for malignant neoplasm of breast: Secondary | ICD-10-CM

## 2017-07-01 ENCOUNTER — Ambulatory Visit: Payer: Medicare Other | Admitting: Cardiology

## 2017-07-01 ENCOUNTER — Encounter: Payer: Self-pay | Admitting: Cardiology

## 2017-07-01 DIAGNOSIS — I351 Nonrheumatic aortic (valve) insufficiency: Secondary | ICD-10-CM | POA: Insufficient documentation

## 2017-07-01 DIAGNOSIS — I712 Thoracic aortic aneurysm, without rupture, unspecified: Secondary | ICD-10-CM | POA: Insufficient documentation

## 2017-07-01 DIAGNOSIS — I77810 Thoracic aortic ectasia: Secondary | ICD-10-CM | POA: Insufficient documentation

## 2017-07-01 NOTE — Patient Instructions (Addendum)
NO MEDICATION CHANGES    TEST  SCHEDULE AT [address] Your physician has requested that you have an echocardiogram. Echocardiography is a painless test that uses sound waves to create images of your heart. It provides your doctor with information about the size and shape of your heart and how well your heart's chambers and valves are working. This procedure takes approximately one hour. There are no restrictions for this procedure.   LABS  LIPID  CMP DO NOT EAT OR DRINK THE MORNING OF THR TEST   MAY DO THE LAB WORK SAME DAY AS ECHO IF YOU  LIKE AT [address] - FIRST FLOOR OR YOU CAN USE THE LAB HERE ATA THIS OFFICE.   Your physician recommends that you schedule a follow-up appointment in 1 MONTH WITH DR HARDING.    If you need a refill on your cardiac medications before your next appointment, please call your pharmacy.

## 2017-07-01 NOTE — Progress Notes (Signed)
PCP: Merri Brunette, MD / Lauretta Chester, PA - [facility]  Clinic Note: Chief Complaint  Patient presents with  . New Patient (Initial Visit)    Thoracic Aortic Aneurysm.  The    HPI: Andrea Mata is a 72 y.o. female who is being seen today for the evaluation of Thoracic Aortic Aneurysm at the request of Lauretta Chester, [place] & Merri Brunette, MD.  She was evaluated by Dr. Royann Shivers back in April 2014 for episodes of chest discomfort that were probably related to shingles.  Symptoms were difficult to delineate as she has a history of fibromyalgia and reportedly polymyalgia rheumatica.  She was evaluated CPX that did not show any abnormal findings  Andrea Mata was seen by Dr. Rexanne Mano back in July 2018 for a ascending thoracic aortic aneurysm seen on chest CT --> as it turns out [place] had a CT scan of the chest to evaluate a nodule in the right lung apex after neck x-rays showed a possible nodule.  She was actually having C-spine x-rays to evaluate neck pain with her history of fibromyalgia and polymyalgia rheumatica.  I did turn out the CT scan suggested a 4.2 cm fusiform ascending aortic aneurysm.  No family history of connective tissue disorder or aneurysms.  The plan was to follow-up CT scan in a year to evaluate for stability.  She is now referred for cardiology evaluation as well.  She cannot recall having any symptoms.  Recent Hospitalizations: None  Studies Personally Reviewed - (if available, images/films reviewed: From Epic Chart or Care Everywhere)  CPX 08/2012: Essentially normal study  Chest CT without contrast May 2018: Descending thoracic aortic aneurysm 4.2 cm ~at level of right main PA.  Interval History: Andrea Mata presents here today really not noting any symptoms whatsoever.  She says she maybe has some mild swelling at the end of the day, but that usually related to her being on prednisone.  She has lots of aches and pains in her shoulders and neck  from her PMR and fibromyalgia, but really denies any chest tightness or pressure.  No exertional dyspnea beyond deconditioning.  No PND or orthopnea.  Occasional "fluttering sensations, but no rapid irregular heartbeats or palpitations.  No syncope/near syncope. No TIA/amaurosis fugax symptoms. No melena, hematochezia, hematuria, or epstaxis. No claudication.  ROS: A comprehensive was performed. Review of Systems  Constitutional: Positive for malaise/fatigue (Chronic).  HENT: Negative for congestion and nosebleeds.   Respiratory: Negative for cough, shortness of breath and wheezing.   Gastrointestinal: Positive for heartburn. Negative for abdominal pain, blood in stool and vomiting.  Genitourinary: Negative for hematuria.  Musculoskeletal: Positive for joint pain and myalgias.       History of polymyalgia rheumatica and fibromyalgia  Neurological: Positive for weakness (Mild global weakness from PMR). Negative for dizziness and focal weakness.  Psychiatric/Behavioral: Negative for depression and memory loss. The patient is nervous/anxious. The patient does not have insomnia.   All other systems reviewed and are negative.   I have reviewed and (if needed) personally updated the patient's problem list, medications, allergies, past medical and surgical history, social and family history.   Past Medical History:  Diagnosis Date  . Anemia   . Constipation   . Fibromyalgia 2006  . GERD (gastroesophageal reflux disease)   . History of shingles    mid thoracic extending to front of right side of chest  . Hyperlipidemia   . Osteopenia   . Polymyalgia rheumatica (HCC) 2005  Past Surgical History:  Procedure Laterality Date  . ABDOMINAL HYSTERECTOMY  1982   Partial hysterectomy with removal of her left ovary  . adenomatous polyps      Current Meds  Medication Sig  . Cholecalciferol (VITAMIN D HIGH POTENCY) 1000 units capsule Take 1,000 Units by mouth 2 (two) times daily.  .  cyanocobalamin (,VITAMIN B-12,) 1000 MCG/ML injection INJECT IM ONCE MONTHLY  . DULoxetine (CYMBALTA) 60 MG capsule Take 60 mg by mouth daily.   . folic acid (FOLVITE) 1 MG tablet Take 2 mg by mouth daily.  Marland Kitchen LYRICA 100 MG capsule Take 100 mg by mouth 2 (two) times daily.  . methotrexate (RHEUMATREX) 2.5 MG tablet Take 3 tablets by mouth once a week.  . predniSONE (DELTASONE) 5 MG tablet Take 5 mg by mouth daily.    . traZODone (DESYREL) 50 MG tablet Take 50 mg by mouth at bedtime.      No Known Allergies  Social History   Tobacco Use  . Smoking status: Never Smoker  . Smokeless tobacco: Never Used  Substance Use Topics  . Alcohol use: Yes    Comment: occasionally  . Drug use: No   Social History   Social History Narrative   She is a married mother of 2, grandmother of 2.  She lives with her husband of 53 years.  She is a retired [employer].   She never smoked.  She may be has 2 alcoholic beverages a week.   Does not exercise, simply because she does not feel like it.    family history includes Atrial fibrillation in her mother; Colon cancer in her father and mother; Heart attack (age of onset: 18) in her mother.  Wt Readings from Last 3 Encounters:  07/01/17 165 lb 9.6 oz (75.1 kg)  11/11/16 159 lb (72.1 kg)  05/18/11 154 lb 1.6 oz (69.9 kg)    PHYSICAL EXAM BP 128/78   Pulse (!) 58   Ht 5\' 6"  (1.676 m)   Wt 165 lb 9.6 oz (75.1 kg)   BMI 26.73 kg/m  Physical Exam  Constitutional: She is oriented to person, place, and time. She appears well-developed and well-nourished. No distress.  Healthy-appearing.  Well-groomed.  Eyes: Conjunctivae and EOM are normal. Pupils are equal, round, and reactive to light.  Neck: Normal range of motion. Neck supple. No JVD present.  Cardiovascular: Normal rate, regular rhythm, S1 normal, intact distal pulses and normal pulses.  Occasional extrasystoles are present. PMI is not displaced. Exam reveals  no gallop, no S4 and no friction rub.  Murmur heard.  Medium-pitched harsh crescendo-decrescendo early systolic murmur is present with a grade of 2/6 at the upper right sternal border. Physiologically split S2  Pulmonary/Chest: Effort normal and breath sounds normal. No respiratory distress. She has no wheezes. She has no rales.  Abdominal: Bowel sounds are normal. She exhibits no distension. There is no tenderness. There is no rebound.  Musculoskeletal: Normal range of motion. She exhibits edema (Trivial bilateral).  Neurological: She is alert and oriented to person, place, and time. No cranial nerve deficit.  Skin: Skin is warm and dry. She is not diaphoretic.  Psychiatric: She has a normal mood and affect. Her behavior is normal. Judgment and thought content normal.  Nursing note and vitals reviewed.    Adult ECG Report  Rate: 58 ;  Rhythm: sinus bradycardia and RBBB.  Otherwise normal axis, intervals and durations.;   Narrative Interpretation: No EKG since  2014 is available, but that did not show RBBB.   Other studies Reviewed: Additional studies/ records that were reviewed today include:  Recent Labs:  No results found for: CHOL, HDL, LDLCALC, LDLDIRECT, TRIG, CHOLHDL  She is unaware when the last time she had labs checked for lipids was.   ASSESSMENT / PLAN: Problem List Items Addressed This Visit    Thoracic aortic aneurysm (HCC)    4.2 cm thoracic aortic aneurysm in a-year-old that was surreptitiously identified.  Plan for Dr. Laneta SimmersBartle is distal follow-up with CT scan   Later this year.  Until she gets greater than 5 cm, not likely to consider any procedural intervention.  In the setting of having an aortic murmur, will check a 2D echo just to exclude any aortic valve disease that could be potentiating or exacerbating the aneurysm.  Check fasting lipid panel as atherosclerosis is the main etiology for aneurysmal dilation of the aorta.      Relevant Orders   EKG 12-Lead  (Completed)   ECHOCARDIOGRAM COMPLETE   Lipid panel   Comprehensive metabolic panel   Aortic ejection murmur    Soft but notable aortic systolic ejection murmur.  Given that she does have aortic dilation, we will check 2D echo. if no notable stenosis is seen, this could simply be flow murmur because of anemia.      Relevant Orders   EKG 12-Lead (Completed)   ECHOCARDIOGRAM COMPLETE   Lipid panel   Comprehensive metabolic panel      Current medicines are reviewed at length with the patient today. (+/- concerns) n/a The following changes have been made: n/a  Patient Instructions  NO MEDICATION CHANGES    TEST  SCHEDULE AT [address] Your physician has requested that you have an echocardiogram. Echocardiography is a painless test that uses sound waves to create images of your heart. It provides your doctor with information about the size and shape of your heart and how well your heart's chambers and valves are working. This procedure takes approximately one hour. There are no restrictions for this procedure.   LABS  LIPID  CMP DO NOT EAT OR DRINK THE MORNING OF THR TEST   MAY DO THE LAB WORK SAME DAY AS ECHO IF YOU  LIKE AT [address] - FIRST FLOOR OR YOU CAN USE THE LAB HERE ATA THIS OFFICE.   Your physician recommends that you schedule a follow-up appointment in 1 MONTH WITH DR Mikisha Roseland.    If you need a refill on your cardiac medications before your next appointment, please call your pharmacy.     Studies Ordered:   Orders Placed This Encounter  Procedures  . Lipid panel  . Comprehensive metabolic panel  . EKG 12-Lead  . ECHOCARDIOGRAM COMPLETE      Bryan Lemmaavid Phillip Sandler, M.D., M.S. Interventional Cardiologist   Pager # [phone] Phone # [phone] [address] [place], [place] [address]   Thank you for choosing [facility] at [facility]!!

## 2017-07-03 ENCOUNTER — Encounter: Payer: Self-pay | Admitting: Cardiology

## 2017-07-03 NOTE — Assessment & Plan Note (Addendum)
Soft but notable aortic systolic ejection murmur.  Given that she does have aortic dilation, we will check 2D echo. if no notable stenosis is seen, this could simply be flow murmur because of anemia.

## 2017-07-03 NOTE — Assessment & Plan Note (Addendum)
4.2 cm thoracic aortic aneurysm in [age]-year-old that was surreptitiously identified.  Plan for Dr. Laneta SimmersBartle is distal follow-up with CT scan   Later this year.  Until she gets greater than 5 cm, not likely to consider any procedural intervention.  In the setting of having an aortic murmur, will check a 2D echo just to exclude any aortic valve disease that could be potentiating or exacerbating the aneurysm.  Check fasting lipid panel as atherosclerosis is the main etiology for aneurysmal dilation of the aorta.

## 2017-07-07 LAB — COMPREHENSIVE METABOLIC PANEL
A/G RATIO: 1.8 (ref 1.2–2.2)
ALT: 25 IU/L (ref 0–32)
AST: 16 IU/L (ref 0–40)
Albumin: 4 g/dL (ref 3.5–4.8)
Alkaline Phosphatase: 96 IU/L (ref 39–117)
BILIRUBIN TOTAL: 0.5 mg/dL (ref 0.0–1.2)
BUN / CREAT RATIO: 18 (ref 12–28)
BUN: 17 mg/dL (ref 8–27)
CALCIUM: 9.2 mg/dL (ref 8.7–10.3)
CHLORIDE: 101 mmol/L (ref 96–106)
CO2: 28 mmol/L (ref 20–29)
Creatinine, Ser: 0.93 mg/dL (ref 0.57–1.00)
GFR calc Af Amer: 71 mL/min/{1.73_m2} (ref >59)
GFR, EST NON AFRICAN AMERICAN: 61 mL/min/{1.73_m2} (ref >59)
Globulin, Total: 2.2 g/dL (ref 1.5–4.5)
Glucose: 87 mg/dL (ref 65–99)
POTASSIUM: 4.3 mmol/L (ref 3.5–5.2)
Sodium: 143 mmol/L (ref 134–144)
TOTAL PROTEIN: 6.2 g/dL (ref 6.0–8.5)

## 2017-07-07 LAB — LIPID PANEL
CHOL/HDL RATIO: 3.6 ratio (ref 0.0–4.4)
CHOLESTEROL TOTAL: 192 mg/dL (ref 100–199)
HDL: 53 mg/dL (ref >39)
LDL Calculated: 107 mg/dL — ABNORMAL HIGH (ref 0–99)
TRIGLYCERIDES: 161 mg/dL — AB (ref 0–149)
VLDL Cholesterol Cal: 32 mg/dL (ref 5–40)

## 2017-07-15 ENCOUNTER — Telehealth: Payer: Self-pay | Admitting: *Deleted

## 2017-07-15 NOTE — Telephone Encounter (Signed)
-----   Message from [name], MD sent at 07/14/2017  4:18 PM EDT ----- R panel shows relatively good HDL levels, mildly elevated triglycerides.  Total cholesterol is less than 200, but LDL is 107.  I would like it to be below 100 if not closer to 70 with aortic atherosclerosis.  She is not currently any medications.  We can discuss treatment options when I see her back.  [name], MD

## 2017-07-15 NOTE — Telephone Encounter (Signed)
Left message on voicemail,result has been release to mychart , may callback or result will be discuss at next office appointment

## 2017-07-27 ENCOUNTER — Ambulatory Visit (HOSPITAL_COMMUNITY): Payer: Medicare Other | Attending: Cardiovascular Disease

## 2017-07-27 ENCOUNTER — Other Ambulatory Visit: Payer: Self-pay

## 2017-07-27 DIAGNOSIS — I361 Nonrheumatic tricuspid (valve) insufficiency: Secondary | ICD-10-CM | POA: Insufficient documentation

## 2017-07-27 DIAGNOSIS — I712 Thoracic aortic aneurysm, without rupture, unspecified: Secondary | ICD-10-CM

## 2017-07-27 DIAGNOSIS — I351 Nonrheumatic aortic (valve) insufficiency: Secondary | ICD-10-CM | POA: Diagnosis not present

## 2017-07-28 NOTE — Telephone Encounter (Signed)
-----   Message from [name], MD sent at 07/27/2017 11:57 PM EDT ----- Echocardiogram results: Normal left ventricular size, function with ejection fraction of 60-65%.  Not unexpectedly grade 1 diastolic dysfunction. Aortic valve shows no evidence of stenosis, but does show some moderate regurgitation. (This could potentially be from mild dilation of the ascending aorta that was seen on CT scan). We will continue to follow this routinely.  Discuss in more detail during follow-up.  [name], MD

## 2017-07-28 NOTE — Telephone Encounter (Signed)
Left message to call back , release to mychart appt 08/10/17 [facility]

## 2017-08-10 ENCOUNTER — Encounter: Payer: Self-pay | Admitting: Cardiology

## 2017-08-10 ENCOUNTER — Ambulatory Visit: Payer: Medicare Other | Admitting: Cardiology

## 2017-08-10 VITALS — BP 104/62 | HR 74 | Ht 66.0 in | Wt 163.0 lb

## 2017-08-10 DIAGNOSIS — I712 Thoracic aortic aneurysm, without rupture, unspecified: Secondary | ICD-10-CM

## 2017-08-10 DIAGNOSIS — I351 Nonrheumatic aortic (valve) insufficiency: Secondary | ICD-10-CM | POA: Insufficient documentation

## 2017-08-10 NOTE — Patient Instructions (Signed)
MEDICATION INSTRUCTIONS NO CHANGES AT PRESENT TIME     TEST WILL SCHEDULE IN 1 YEAR April 2020 AT [address] Your physician has requested that you have an echocardiogram. Echocardiography is a painless test that uses sound waves to create images of your heart. It provides your doctor with information about the size and shape of your heart and how well your heart's chambers and valves are working. This procedure takes approximately one hour. There are no restrictions for this procedure.     Your physician wants you to follow-up in 12 MONTH WITH DR HARDING.You will receive a reminder letter in the mail two months in advance. If you don't receive a letter, please call our office to schedule the follow-up appointment.  If you need a refill on your cardiac medications before your next appointment, please call your pharmacy.

## 2017-08-10 NOTE — Progress Notes (Signed)
PCP: Merri BrunettePharr, Walter, MD / Lauretta ChesterMary Prevost, PA - [facility]  Clinic Note: Chief Complaint  Patient presents with  . Follow-up    1 month  . Edema    All over.    HPI: Andrea IvanoffBarbara B Mata is a 72 y.o. female who is being seen today for the evaluation of Thoracic Aortic Aneurysm at the request of Lauretta ChesterMary Prevost, [place] & Merri BrunettePharr, Walter, MD.  She was evaluated by Dr. Royann Shiversroitoru back in April 2014 for episodes of chest discomfort that were probably related to shingles.  Symptoms were difficult to delineate as she has a history of fibromyalgia and reportedly polymyalgia rheumatica.  She was evaluated CPX that did not show any abnormal findings.  She was seen by Dr. Rexanne ManoBrian Bartle back in July 2018 for a ascending thoracic aortic aneurysm seen on chest CT --> as it turns out [place] had a CT scan of the chest to evaluate a nodule in the right lung apex after neck x-rays showed a possible nodule.  She was actually having C-spine x-rays to evaluate neck pain with her history of fibromyalgia and polymyalgia rheumatica.  I did turn out the CT scan suggested a 4.2 cm fusiform ascending aortic aneurysm.  No family history of connective tissue disorder or aneurysms.  The plan was to follow-up CT scan in a year to evaluate for stability.  Andrea Mata was seen for initial cardiology evaluation for her TAA.  She cannot recall having any symptoms.  Recent Hospitalizations: None  Studies Personally Reviewed - (if available, images/films reviewed: From Epic Chart or Care Everywhere)  Transthoracic Echo June 26, 2017: EF 60-65%.  Mild LVH.  GR 1 DD.  No regional wall motion normality.  No aortic stenosis but with moderate aortic regurgitation.  A sending aorta mildly dilated (40 mm).  Trivial MR.  Mild LA dilation.  Otherwise normal.  Interval History: Andrea Mata presents here today again really not having any notable cardiac symptoms to speak of.  She has no angina or heart failure symptoms.  Just  occasional fluttering in her chest.  She has mild and a day swelling made worse by her prednisone.  Not requiring any diuretic. Remains deconditioned with no major activity.  Diffuse body ache symptoms from shoulders and neck the legs from her PMR and fibromyalgia.  Cardiovascular review of symptoms: no chest pain or dyspnea on exertion positive for - edema, palpitations, rapid heart rate, shortness of breath and Only with exertion negative for - irregular heartbeat, murmur, orthopnea, paroxysmal nocturnal dyspnea, rapid heart rate or shortness of breath    ROS: A comprehensive was performed. Review of Systems  Constitutional: Positive for malaise/fatigue (Chronic).  HENT: Negative for congestion and nosebleeds.   Respiratory: Negative for cough, shortness of breath and wheezing.   Gastrointestinal: Positive for heartburn. Negative for abdominal pain, blood in stool and vomiting.  Genitourinary: Negative for hematuria.  Musculoskeletal: Positive for joint pain and myalgias.       History of polymyalgia rheumatica and fibromyalgia  Neurological: Positive for weakness (Mild global weakness from PMR). Negative for dizziness and focal weakness.  Psychiatric/Behavioral: Negative for depression and memory loss. The patient is nervous/anxious. The patient does not have insomnia.   All other systems reviewed and are negative.   I have reviewed and (if needed) personally updated the patient's problem list, medications, allergies, past medical and surgical history, social and family history.   Past Medical History:  Diagnosis Date  . Anemia   . Constipation   .  Fibromyalgia 2006  . GERD (gastroesophageal reflux disease)   . History of shingles    mid thoracic extending to front of right side of chest  . Hyperlipidemia   . Osteopenia   . Polymyalgia rheumatica (HCC) 2005    Past Surgical History:  Procedure Laterality Date  . ABDOMINAL HYSTERECTOMY  1982   Partial hysterectomy with  removal of her left ovary  . adenomatous polyps    . TRANSTHORACIC ECHOCARDIOGRAM  06/26/2016   She gets quite short of breath and when it fasts even get some chest discomfort and tightness associated with it.  It takes her a while for heart rate to come back down again when she stops, but she is not been able to do her routine exercise routine just but anything makes her go fast.  Is been getting worse over the last month or 2.  When it goes really fast she will get dizzy but has not had an    CPX 08/2012: Essentially normal study  Chest CT without contrast May 2018: Descending thoracic aortic aneurysm 4.2 cm ~at level of right main PA.  Current Meds  Medication Sig  . Cholecalciferol (VITAMIN D HIGH POTENCY) 1000 units capsule Take 1,000 Units by mouth 2 (two) times daily.  . cyanocobalamin (,VITAMIN B-12,) 1000 MCG/ML injection INJECT IM ONCE MONTHLY  . DULoxetine (CYMBALTA) 60 MG capsule Take 60 mg by mouth daily.   . folic acid (FOLVITE) 1 MG tablet Take 2 mg by mouth daily.  Marland Kitchen LYRICA 100 MG capsule Take 100 mg by mouth 2 (two) times daily.  . methotrexate (RHEUMATREX) 2.5 MG tablet Take 3 tablets by mouth once a week.  . predniSONE (DELTASONE) 5 MG tablet Take 5 mg by mouth daily.    . traZODone (DESYREL) 50 MG tablet Take 50 mg by mouth at bedtime.      No Known Allergies  Social History   Tobacco Use  . Smoking status: Never Smoker  . Smokeless tobacco: Never Used  Substance Use Topics  . Alcohol use: Yes    Comment: occasionally  . Drug use: No   Social History   Social History Narrative   She is a married mother of 2, grandmother of 2.  She lives with her husband of 53 years.  She is a retired [employer].   She never smoked.  She may be has 2 alcoholic beverages a week.   Does not exercise, simply because she does not feel like it.   Family History family history includes Atrial fibrillation in her mother; Colon cancer in her  father and mother; Heart attack (age of onset: 4) in her mother.  Wt Readings from Last 3 Encounters:  08/10/17 163 lb (73.9 kg)  07/01/17 165 lb 9.6 oz (75.1 kg)  11/11/16 159 lb (72.1 kg)    PHYSICAL EXAM BP 104/62 (BP Location: Left Arm, Patient Position: Sitting, Cuff Size: Normal)   Pulse 74   Ht 5\' 6"  (1.676 m)   Wt 163 lb (73.9 kg)   BMI 26.31 kg/m  Physical Exam  Constitutional: She is oriented to person, place, and time. She appears well-developed and well-nourished. No distress.  Healthy-appearing.  Well-groomed.  Eyes: Pupils are equal, round, and reactive to light. Conjunctivae and EOM are normal.  Neck: Normal range of motion. Neck supple. No JVD present.  Cardiovascular: Normal rate, regular rhythm, S1 normal, intact distal pulses and normal pulses.  Occasional extrasystoles are present. PMI is not displaced.  Exam reveals no gallop, no S4 and no friction rub.  Murmur heard.  Medium-pitched harsh crescendo-decrescendo early systolic murmur is present with a grade of 2/6 at the upper right sternal border. Physiologically split S2  Pulmonary/Chest: Effort normal and breath sounds normal. No respiratory distress. She has no wheezes. She has no rales.  Abdominal: Bowel sounds are normal. She exhibits no distension. There is no tenderness. There is no rebound.  Musculoskeletal: Normal range of motion. She exhibits edema (Trivial bilateral).  Neurological: She is alert and oriented to person, place, and time. No cranial nerve deficit.  Skin: She is not diaphoretic.  Psychiatric: She has a normal mood and affect. Her behavior is normal. Judgment and thought content normal.  Nursing note and vitals reviewed.    Adult ECG Report Not checked  Other studies Reviewed: Additional studies/ records that were reviewed today include:  Recent Labs:   Lab Results  Component Value Date   CHOL 192 07/07/2017   HDL 53 07/07/2017   LDLCALC 107 (H) 07/07/2017   TRIG 161 (H)  07/07/2017   CHOLHDL 3.6 07/07/2017    She is unaware when the last time she had labs checked for lipids was.   ASSESSMENT / PLAN: Overall asymptomatic.  I do not think a thoracic aortic aneurysm is related to the systolic murmur.  Moderate aortic regurgitation noted may very well be related to dilated aortic root.  Continue to monitor as well.  No symptoms.  I did discuss findings and potential symptoms to be on look out for.    Annual follow-up   Problem List Items Addressed This Visit    Thoracic aortic aneurysm (HCC)   Moderate aortic regurgitation - Primary (Chronic)    Relatively stable now with recent echo.  Follow-up echo next year prior to 1 year follow-up.      Relevant Orders   ECHOCARDIOGRAM COMPLETE   Aortic ejection murmur    Quite soft.  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 May need to follow diastolic murmur of aortic regurgitation closely.      Relevant Orders   ECHOCARDIOGRAM COMPLETE      Current medicines are reviewed at length with the patient today. (+/- concerns) n/a The following changes have been made: n/a  Patient Instructions  MEDICATION INSTRUCTIONS NO CHANGES AT PRESENT TIME     TEST WILL SCHEDULE IN 1 YEAR April 2020 AT [address] Your physician has requested that you have an echocardiogram. Echocardiography is a painless test that uses sound waves to create images of your heart. It provides your doctor with information about the size and shape of your heart and how well your heart's chambers and valves are working. This procedure takes approximately one hour. There are no restrictions for this procedure.     Your physician wants you to follow-up in 12 MONTH WITH DR HARDING.You will receive a reminder letter in the mail two months in advance. If you don't receive a letter, please call our office to schedule the follow-up appointment.  If you need a refill on your cardiac  medications before your next appointment, please call your pharmacy.      Studies Ordered:   Orders Placed This Encounter  Procedures  . ECHOCARDIOGRAM COMPLETE      Bryan Lemma, M.D., M.S. Interventional Cardiologist   Pager # [phone] Phone # [phone] [address] [place], [place] [address]   Thank you for choosing Heartcare at [facility]!!

## 2017-08-12 ENCOUNTER — Encounter: Payer: Self-pay | Admitting: Cardiology

## 2017-08-12 NOTE — Assessment & Plan Note (Signed)
Quite soft.  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 May need to follow diastolic murmur of aortic regurgitation closely.

## 2017-08-12 NOTE — Assessment & Plan Note (Signed)
Relatively stable now with recent echo.  Follow-up echo next year prior to 1 year follow-up.

## 2017-10-14 ENCOUNTER — Other Ambulatory Visit: Payer: Self-pay | Admitting: Surgery

## 2017-10-14 DIAGNOSIS — I712 Thoracic aortic aneurysm, without rupture, unspecified: Secondary | ICD-10-CM

## 2017-11-02 ENCOUNTER — Other Ambulatory Visit: Payer: Self-pay | Admitting: Internal Medicine

## 2017-11-02 DIAGNOSIS — Z1231 Encounter for screening mammogram for malignant neoplasm of breast: Secondary | ICD-10-CM

## 2017-11-24 ENCOUNTER — Encounter: Payer: Self-pay | Admitting: Surgery

## 2017-11-24 ENCOUNTER — Ambulatory Visit: Payer: Medicare Other | Admitting: Surgery

## 2017-11-24 ENCOUNTER — Ambulatory Visit
Admission: RE | Admit: 2017-11-24 | Discharge: 2017-11-24 | Disposition: A | Discharge status: Home / Self Care | Payer: Medicare Other | Source: Ambulatory Visit | Attending: Surgery | Admitting: Surgery

## 2017-11-24 ENCOUNTER — Other Ambulatory Visit: Payer: Self-pay

## 2017-11-24 VITALS — BP 109/71 | HR 58 | Resp 16 | Ht 66.0 in | Wt 168.0 lb

## 2017-11-24 DIAGNOSIS — I712 Thoracic aortic aneurysm, without rupture, unspecified: Secondary | ICD-10-CM

## 2017-11-24 NOTE — Progress Notes (Signed)
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  She has history of fibromyalgia and polymyalgia rheumatica and has been treated with prednisone and methotrexate.  There is no family history of aneurysm, aortic dissection, or connective tissue disease.  She saw Dr. Herbie Baltimore recently and had a follow-up echocardiogram on 07/27/2017 which showed normal left ventricular systolic function with ejection fraction of 60 to 65%.  There is grade 1 diastolic dysfunction.  Aortic valve was trileaflet with no stenosis and moderate regurgitation.  Left ventricular internal dimensions were within normal limits.  She denies any chest pain or pressure.  She denies exertional fatigue and shortness of breath.  He has had no peripheral edema.  Current Outpatient Medications  Medication Sig Dispense Refill  . Cholecalciferol (VITAMIN D HIGH POTENCY) 1000 units capsule Take 1,000 Units by mouth 2 (two) times daily.    . cyanocobalamin (,VITAMIN B-12,) 1000 MCG/ML injection INJECT IM ONCE MONTHLY  1  . DULoxetine (CYMBALTA) 60 MG capsule Take 60 mg by mouth daily.     . folic acid (FOLVITE) 1 MG tablet Take 2 mg by mouth daily.  3  . LYRICA 100 MG capsule Take 100 mg by mouth 2 (two) times daily.  1  . methotrexate (RHEUMATREX) 2.5 MG tablet Take 3 tablets by mouth once a week.  3  . predniSONE (DELTASONE) 5 MG tablet Take 5 mg by mouth daily.      . traZODone (DESYREL) 50 MG tablet Take 50 mg by mouth at bedtime.       No current facility-administered medications for this visit.      Physical Exam: BP 109/71 (BP Location: Right Arm, Patient Position: Sitting, Cuff Size: Normal)   Pulse (!) 58   Resp 16   Ht 5\' 6"  (1.676 m)   Wt 168 lb (76.2 kg)   SpO2 95% Comment: ON RA  BMI 27.12 kg/m  She looks well. Cardiac exam shows a regular rate and rhythm with a grade 2/6 systolic murmur along the right sternal  border.  There is no diastolic murmur. Lungs are clear. There is no peripheral edema.  Diagnostic Tests:  CLINICAL DATA:  Thoracoabdominal aneurysm  EXAM: CT CHEST WITHOUT CONTRAST  TECHNIQUE: Multidetector CT imaging of the chest was performed following the standard protocol without IV contrast.  COMPARISON:  09/07/2016  FINDINGS: Cardiovascular: Maximal diameter of the ascending aorta is 4.0 cm. Minimal aortic valvular calcification. Atherosclerotic calcification of the aortic arch is noted. The left ventricle is somewhat dilated.  Mediastinum/Nodes: No abnormal mediastinal adenopathy. No pericardial effusion.  Lungs/Pleura: There is scarring at the lung apices. No pneumothorax. No pleural effusion. Minimal dependent atelectasis. Increased AP diameter of the chest is a feature of COPD. Patchy opacity at the base of the lingula is felt to be related to atelectasis. No evidence of lung mass.  Upper Abdomen: Stable right lobe liver mass, characterized as a hemangioma on MRI.  Musculoskeletal: No vertebral compression deformity.  IMPRESSION: Maximal diameter of the ascending aorta is 4.0 cm. This is not significantly changed. Recommend annual imaging followup by CTA or MRA. This recommendation follows 2010 ACCF/AHA/AATS/ACR/ASA/SCA/SCAI/SIR/STS/SVM Guidelines for the Diagnosis and Management of Patients with Thoracic Aortic Disease. Circulation. 2010; 121: Z610-R604  Scattered subsegmental atelectasis.  Aortic Atherosclerosis (ICD10-I70.0).   Electronically Signed   By: Jolaine Click M.D.   On: 11/24/2017 09:14     *  [facility]*                        [address]                        [place], [place] [address]                            [phone]  ------------------------------------------------------------------- Echocardiography  Patient:    Danyelle, [place] MR #:       [id number] Study Date: 07/27/2017 Gender:     F Age:         72 Height:     167.6 cm Weight:     75.1 kg BSA:        1.89 m^2 Pt. Status: Room:   SONOGRAPHER  [place], Will  ATTENDING    Nathanuel Cabreja Lemma, MD  ORDERING     Taleigh Gero Lemma, MD  REFERRING    Lakayla Barrington Lemma, MD  PERFORMING   [facility], Outpatient  cc:  ------------------------------------------------------------------- LV EF: 60% -   65%  ------------------------------------------------------------------- Indications:      (R01.1).  ------------------------------------------------------------------- History:   PMH:  Thoracic aortic aneurysm. Acquired from the patient and from the patient&'s chart.  Murmur.  Risk factors: Dyslipidemia.  ------------------------------------------------------------------- Study Conclusions  - Left ventricle: The cavity size was normal. Wall thickness was   increased in a pattern of mild LVH. Systolic function was normal.   The estimated ejection fraction was in the range of 60% to 65%.   Wall motion was normal; there were no regional wall motion   abnormalities. Doppler parameters are consistent with abnormal   left ventricular relaxation (grade 1 diastolic dysfunction). - Aortic valve: Transvalvular velocity was within the normal range.   There was no stenosis. There was moderate regurgitation. - Aorta: Ascending aortic diameter: 40 mm (S). - Ascending aorta: The ascending aorta was mildly dilated. - Mitral valve: Transvalvular velocity was within the normal range.   There was no evidence for stenosis. There was trivial   regurgitation. - Left atrium: The atrium was mildly dilated. - Right ventricle: The cavity size was normal. Wall thickness was   normal. Systolic function was normal. - Atrial septum: No defect or patent foramen ovale was identified. - Tricuspid valve: There was mild regurgitation. - Pulmonary arteries: Systolic pressure was within the normal   range. PA peak pressure: 21 mm Hg  (S).  ------------------------------------------------------------------- Study data:  No prior study was available for comparison.  Study status:  Routine.  Procedure:  The patient reported no pain pre or post test. Transthoracic echocardiography for left ventricular function evaluation and for assessment of valvular function. Image quality was adequate.  Study completion:  There were no complications.          Echocardiography.  M-mode, complete 2D, spectral Doppler, and color Doppler.  Birthdate:  Patient birthdate: 11/18/1945.  Age:  Patient is 72 yr old.  Sex:  Gender: female.    BMI: 26.7 kg/m^2.  Blood pressure:     128/78  Patient status:  Outpatient.  Study date:  Study date: 07/27/2017. Study time: 01:24 PM.  Location:  [facility]  -------------------------------------------------------------------  ------------------------------------------------------------------- Left ventricle:  The cavity size was normal. Wall thickness was increased in a pattern of mild LVH. Systolic function was normal. The estimated ejection fraction was in the range of 60% to 65%. Wall motion  was normal; there were no regional wall motion abnormalities. Doppler parameters are consistent with abnormal left ventricular relaxation (grade 1 diastolic dysfunction).  ------------------------------------------------------------------- Aortic valve:   Trileaflet; mildly thickened, mildly calcified leaflets. Mobility was not restricted.  Doppler:  Transvalvular velocity was within the normal range. There was no stenosis. There was moderate regurgitation.  ------------------------------------------------------------------- Aorta:  Aortic root: The aortic root was normal in size. Ascending aorta: The ascending aorta was mildly dilated.  ------------------------------------------------------------------- Mitral valve:   Structurally normal valve.   Mobility was not restricted.  Doppler:   Transvalvular velocity was within the normal range. There was no evidence for stenosis. There was trivial regurgitation.    Peak gradient (D): 4 mm Hg.  ------------------------------------------------------------------- Left atrium:  The atrium was mildly dilated.  ------------------------------------------------------------------- Atrial septum:  No defect or patent foramen ovale was identified.   ------------------------------------------------------------------- Right ventricle:  The cavity size was normal. Wall thickness was normal. Systolic function was normal.  ------------------------------------------------------------------- Pulmonic valve:    Structurally normal valve.   Cusp separation was normal.  Doppler:  Transvalvular velocity was within the normal range. There was no evidence for stenosis. There was no regurgitation.  ------------------------------------------------------------------- Tricuspid valve:   Structurally normal valve.    Doppler: Transvalvular velocity was within the normal range. There was mild regurgitation.  ------------------------------------------------------------------- Pulmonary artery:   The main pulmonary artery was normal-sized. Systolic pressure was within the normal range.  ------------------------------------------------------------------- Right atrium:  The atrium was normal in size.  ------------------------------------------------------------------- Pericardium:  There was no pericardial effusion.  ------------------------------------------------------------------- Systemic veins: Inferior vena cava: The vessel was normal in size. The respirophasic diameter changes were in the normal range (>= 50%), consistent with normal central venous pressure.  ------------------------------------------------------------------- Measurements   Left ventricle                           Value        Reference  LV ID, ED, PLAX chordal           (L)     40.2  mm     43 - 52  LV ID, ES, PLAX chordal                  25.5  mm     23 - 38  LV fx shortening, PLAX chordal           37    %      >=29  LV PW thickness, ED                      12    mm     ---------  IVS/LV PW ratio, ED                      1.03         <=1.3  Stroke volume, 2D                        72    ml     ---------  Stroke volume/bsa, 2D                    38    ml/m^2 ---------  LV ejection fraction, 1-p A4C            66    %      ---------  LV end-diastolic volume, 2-p  60    ml     ---------  LV end-systolic volume, 2-p              23    ml     ---------  LV ejection fraction, 2-p                62    %      ---------  Stroke volume, 2-p                       37    ml     ---------  LV end-diastolic volume/bsa, 2-p         32    ml/m^2 ---------  LV end-systolic volume/bsa, 2-p          12    ml/m^2 ---------  Stroke volume/bsa, 2-p                   19.6  ml/m^2 ---------  LV e&', lateral                           7.8   cm/s   ---------  LV E/e&', lateral                         12.09        ---------  LV e&', medial                            7.8   cm/s   ---------  LV E/e&', medial                          12.09        ---------  LV e&', average                           7.8   cm/s   ---------  LV E/e&', average                         12.09        ---------    Ventricular septum                       Value        Reference  IVS thickness, ED                        12.3  mm     ---------    LVOT                                     Value        Reference  LVOT ID, S                               21    mm     ---------  LVOT area                                3.46  cm^2   ---------  LVOT  ID                                  21    mm     ---------  LVOT peak velocity, S                    89.5  cm/s   ---------  LVOT mean velocity, S                    58.3  cm/s   ---------  LVOT VTI, S                              20.7  cm      ---------  LVOT peak gradient, S                    3     mm Hg  ---------  Stroke volume (SV), LVOT DP              71.7  ml     ---------  Stroke index (SV/bsa), LVOT DP           38    ml/m^2 ---------    Aortic valve                             Value        Reference  Aortic regurg pressure half-time         433   ms     ---------    Aorta                                    Value        Reference  Aortic root ID, ED                       34    mm     ---------  Ascending aorta ID, A-P, S               40    mm     ---------    Left atrium                              Value        Reference  LA ID, A-P, ES                           33    mm     ---------  LA ID/bsa, A-P                           1.75  cm/m^2 <=2.2  LA volume, S                             51    ml     ---------  LA volume/bsa, S                         27    ml/m^2 ---------  LA volume, ES, 1-p A4C                   47    ml     ---------  LA volume/bsa, ES, 1-p A4C               24.9  ml/m^2 ---------  LA volume, ES, 1-p A2C                   54    ml     ---------  LA volume/bsa, ES, 1-p A2C               28.6  ml/m^2 ---------    Mitral valve                             Value        Reference  Mitral E-wave peak velocity              94.3  cm/s   ---------  Mitral A-wave peak velocity              100   cm/s   ---------  Mitral deceleration time                 190   ms     150 - 230  Mitral peak gradient, D                  4     mm Hg  ---------  Mitral E/A ratio, peak                   0.9          ---------    Pulmonary arteries                       Value        Reference  PA pressure, S, DP                       21    mm Hg  <=30    Tricuspid valve                          Value        Reference  Tricuspid regurg peak velocity           210   cm/s   ---------  Tricuspid peak RV-RA gradient            18    mm Hg  ---------    Systemic veins                           Value        Reference  Estimated  CVP                            3     mm Hg  ---------    Right ventricle                          Value        Reference  RV pressure, S, DP  21    mm Hg  <=30  RV s&', lateral, S                        12    cm/s   ---------  Legend: (L)  and  (H)  mark values outside specified reference range.  ------------------------------------------------------------------- Prepared and Electronically Authenticated by  Chilton Si, MD 2019-04-02T14:32:56   Impression:  She has a stable fusiform ascending aortic aneurysm that is measured at 4.0 cm on her current CT scan of the chest.  This is unchanged from last year when it was measured at 4.2 cm.  Her recent echocardiogram shows a trileaflet aortic valve with mildly thickened and calcified leaflets and moderate regurgitation with no stenosis.  This regurgitation could possibly be due to the aortic enlargement although her aortic root does not appear significantly enlarged and was measured at 34 mm on the echocardiogram.  Her ascending aortic aneurysm is stable and well below the 5.5 cm surgical threshold.  I have recommended continued yearly follow-up of her aneurysm and aortic insufficiency.  Plan:  I will see her back in 1 year with a CT scan of the chest without contrast to follow-up on her ascending aortic aneurysm.  She will follow-up with Dr. Herbie Baltimore for her aortic insufficiency with repeat echocardiogram in about 1 year.   I spent 15 minutes performing this established patient evaluation and > 50% of this time was spent face to face counseling and coordinating the care of this patient's aortic aneurysm.  Alleen Borne, MD [facility] [phone]

## 2017-11-25 ENCOUNTER — Ambulatory Visit
Admission: RE | Admit: 2017-11-25 | Discharge: 2017-11-25 | Disposition: A | Discharge status: Home / Self Care | Payer: Medicare Other | Source: Ambulatory Visit | Attending: Internal Medicine | Admitting: Internal Medicine

## 2017-11-25 DIAGNOSIS — Z1231 Encounter for screening mammogram for malignant neoplasm of breast: Secondary | ICD-10-CM

## 2018-07-26 ENCOUNTER — Telehealth: Payer: Self-pay | Admitting: Cardiovascular Disease

## 2018-07-26 NOTE — Telephone Encounter (Signed)
Spoke with patient ok to cancel echo 4/6 till 3 months due to concerns for [facility] virus

## 2018-08-01 ENCOUNTER — Other Ambulatory Visit (HOSPITAL_COMMUNITY): Payer: Medicare Other

## 2018-08-09 ENCOUNTER — Telehealth: Payer: Self-pay | Admitting: *Deleted

## 2018-08-09 NOTE — Telephone Encounter (Signed)
   Primary Cardiologist:  No primary care provider on file.   Patient contacted.  History reviewed.  No symptoms to suggest any unstable cardiac conditions.  Based on discussion, with current pandemic situation, we will be postponing this appointment for Andrea Mata with a plan for f/u in June 24 ,2020 at 3:20 pm or sooner if feasible/necessary.  If symptoms change, she has been instructed to contact our office.   Tobin Chad, RN  08/09/2018 4:10 PM         .

## 2018-08-15 ENCOUNTER — Ambulatory Visit: Payer: Medicare Other | Admitting: Cardiology

## 2018-08-26 ENCOUNTER — Other Ambulatory Visit: Payer: Self-pay | Admitting: Surgery

## 2018-08-26 DIAGNOSIS — I712 Thoracic aortic aneurysm, without rupture, unspecified: Secondary | ICD-10-CM

## 2018-09-07 ENCOUNTER — Other Ambulatory Visit: Payer: Medicare Other

## 2018-09-08 ENCOUNTER — Other Ambulatory Visit (HOSPITAL_COMMUNITY): Payer: Medicare Other

## 2018-09-23 ENCOUNTER — Other Ambulatory Visit: Payer: Self-pay | Admitting: Internal Medicine

## 2018-09-23 DIAGNOSIS — R1084 Generalized abdominal pain: Secondary | ICD-10-CM

## 2018-09-26 HISTORY — PX: TRANSTHORACIC ECHOCARDIOGRAM: SHX275

## 2018-10-05 ENCOUNTER — Telehealth (HOSPITAL_COMMUNITY): Payer: Self-pay | Admitting: Radiology

## 2018-10-05 NOTE — Telephone Encounter (Signed)

## 2018-10-06 ENCOUNTER — Other Ambulatory Visit: Payer: Self-pay

## 2018-10-06 ENCOUNTER — Ambulatory Visit (HOSPITAL_COMMUNITY): Payer: Medicare Other | Attending: Internal Medicine

## 2018-10-06 DIAGNOSIS — I351 Nonrheumatic aortic (valve) insufficiency: Secondary | ICD-10-CM | POA: Diagnosis present

## 2018-10-10 ENCOUNTER — Telehealth: Payer: Self-pay | Admitting: *Deleted

## 2018-10-10 ENCOUNTER — Ambulatory Visit
Admission: RE | Admit: 2018-10-10 | Discharge: 2018-10-10 | Disposition: A | Discharge status: Home / Self Care | Payer: Medicare Other | Source: Ambulatory Visit | Attending: Internal Medicine | Admitting: Internal Medicine

## 2018-10-10 ENCOUNTER — Other Ambulatory Visit: Payer: Self-pay

## 2018-10-10 DIAGNOSIS — R1084 Generalized abdominal pain: Secondary | ICD-10-CM

## 2018-10-10 MED ORDER — IOPAMIDOL (ISOVUE-300) INJECTION 61%
100.0000 mL | Freq: Once | INTRAVENOUS | Status: AC | PRN
  Administered 2018-10-10: 100 mL via INTRAVENOUS

## 2018-10-10 NOTE — Telephone Encounter (Signed)
   TELEPHONE CALL NOTE  This patient has been deemed a candidate for follow-up tele-health visit to limit community exposure during the Covid-19 pandemic. I spoke with the patient via phone to discuss instructions.  The patient will receive a phone call 2-3 days prior to their E-Visit at which time consent will be verbally confirmed.   A Virtual Office Visit appointment type has been scheduled for 6/24 with [facility]ing, with "VIDEO"/text I have confirmed the patient is active in [place].    Raiford Simmonds, RN 10/10/2018 11:01 AM

## 2018-10-13 ENCOUNTER — Telehealth: Payer: Self-pay | Admitting: Cardiology

## 2018-10-13 NOTE — Telephone Encounter (Signed)
Mychart, smartphone, consent, pre reg complete 10/13/18 AF °

## 2018-10-19 ENCOUNTER — Telehealth (INDEPENDENT_AMBULATORY_CARE_PROVIDER_SITE_OTHER): Payer: Medicare Other | Admitting: Cardiology

## 2018-10-19 ENCOUNTER — Telehealth: Payer: Self-pay | Admitting: *Deleted

## 2018-10-19 DIAGNOSIS — I351 Nonrheumatic aortic (valve) insufficiency: Secondary | ICD-10-CM

## 2018-10-19 DIAGNOSIS — I712 Thoracic aortic aneurysm, without rupture, unspecified: Secondary | ICD-10-CM

## 2018-10-19 NOTE — Patient Instructions (Addendum)
Medication Instructions:   None  If you need a refill on your cardiac medications before your next appointment, please call your pharmacy.   Lab work: None    Testing/Procedures: None  Follow-Up: At [facility], you and your health needs are our priority.  As part of our continuing mission to provide you with exceptional heart care, we have created designated Provider Care Teams.  These Care Teams include your primary Cardiologist (physician) and Advanced Practice Providers (APPs -  Physician Assistants and Nurse Practitioners) who all work together to provide you with the care you need, when you need it. . You will need a follow up appointment in  12 months  June 2021.  Please call our office 2 months in advance to schedule this appointment.  You may see Glenetta Hew, MD or one of the following Advanced Practice Providers on your designated Care Team:   . Rosaria Ferries, PA-C . Jory Sims, DNP, ANP  Any Other Special Instructions Will Be Listed Below .  --If the chest discomfort that you feel off and on starts happening with more frequency, more intensity, or with less activity, please let us know and we would reconsider checking a stress test.

## 2018-10-19 NOTE — Progress Notes (Signed)
Virtual Visit via Video Note   This visit type was conducted due to national recommendations for restrictions regarding the COVID-19 Pandemic (e.g. social distancing) in an effort to limit this patient's exposure and mitigate transmission in our community.  Due to her co-morbid illnesses, this patient is at least at moderate risk for complications without adequate follow up.  This format is felt to be most appropriate for this patient at this time.  All issues noted in this document were discussed and addressed.  A limited physical exam was performed with this format.  Please refer to the patient's chart for her consent to telehealth for [facility].   Patient has given verbal permission to conduct this visit via virtual appointment and to bill insurance 10/24/2018 5:06 PM     Evaluation Performed:  Follow-up visit  Date:  10/24/2018   ID:  Andrea, Mata 26-Mar-1946, MRN [id number]  Patient Location: Home Provider Location: Home  PCP:  Deland Pretty, MD  Cardiologist:   Glenetta Hew, MD  Electrophysiologist:  None   Chief Complaint: No complaints.  Annual follow-up  History of Present Illness:    Andrea Mata is a 73 y.o. female with PMH notable for thoracic aortic aneurysm (noted on CT scan) along with fibromyalgia and polymyalgia rheumatica.  Who presents via audio/video conferencing for a telehealth visit today.  Andrea Mata was last seen in April 2019.  Was doing well with no notable cardiac symptoms just occasional fluttering the chest. -Echo to be done prior to this visit  Interval History:  Andrea Mata presents here today via teleconferencing again doing very well with no major cardiac symptoms.  She still has her chronic fatigue and myalgias/arthralgias from polymyalgia and fibromyalgia, but denies any chest tightness or pressure with rest or exertion. Rare fluttering in the chest. Remains deconditioned with minimal activity.  Cardiovascular ROS: positive  for - Shortness of breath with exertion if she overdoes it.  She is quite sedentary. negative for - chest pain, irregular heartbeat, loss of consciousness, orthopnea, palpitations, paroxysmal nocturnal dyspnea, rapid heart rate, shortness of breath or Near syncope, TIA/amaurosis fugax, claudication.  The patient does not have symptoms concerning for COVID-19 infection (fever, chills, cough, or new shortness of breath).  The patient is practicing social distancing.  Son helps with groceries.   ROS:  Please see the history of present illness.    Review of Systems  Constitutional: Positive for malaise/fatigue (Tires easily, but is somewhat deconditioned). Negative for weight loss.  HENT: Negative for congestion, nosebleeds and sinus pain.   Respiratory: Negative for cough, sputum production and shortness of breath.   Gastrointestinal: Negative for blood in stool, constipation, heartburn and melena.  Genitourinary: Negative for dysuria, flank pain and hematuria.  Musculoskeletal: Positive for joint pain, myalgias and neck pain.       From PMR  Neurological: Negative for dizziness, focal weakness and headaches.  Psychiatric/Behavioral: Negative.   All other systems reviewed and are negative.   Past Medical History:  Diagnosis Date  . Anemia   . Constipation   . Fibromyalgia 2006  . GERD (gastroesophageal reflux disease)   . History of shingles    mid thoracic extending to front of right side of chest  . Hyperlipidemia   . Osteopenia   . Polymyalgia rheumatica ([place]) 2005   Past Surgical History:  Procedure Laterality Date  . ABDOMINAL HYSTERECTOMY  1982   Partial hysterectomy with removal of her left ovary  . adenomatous polyps    .  TRANSTHORACIC ECHOCARDIOGRAM  06/26/2016   She gets quite short of breath and when it fasts even get some chest discomfort and tightness associated with it.  It takes her a while for heart rate to come back down again when she stops, but she is not been  able to do her routine exercise routine just but anything makes her go fast.  Is been getting worse over the last month or 2.  When it goes really fast she will get dizzy but has not had an     Current Meds  Medication Sig  . Cholecalciferol (VITAMIN D HIGH POTENCY) 1000 units capsule Take 1,000 Units by mouth 2 (two) times daily.  . cyanocobalamin (,VITAMIN B-12,) 1000 MCG/ML injection INJECT 1ML IM ONCE MONTHLY  . DULoxetine (CYMBALTA) 60 MG capsule Take 60 mg by mouth daily.   . folic acid (FOLVITE) 1 MG tablet Take 2 mg by mouth daily.  Marland Kitchen. LYRICA 100 MG capsule Take 100 mg by mouth 2 (two) times daily.  . methotrexate (RHEUMATREX) 2.5 MG tablet Take 3 tablets by mouth once a week.  . predniSONE (DELTASONE) 5 MG tablet Take 5 mg by mouth daily.    . traZODone (DESYREL) 50 MG tablet Take 50 mg by mouth at bedtime.       Allergies:   Patient has no known allergies.   Social History   Tobacco Use  . Smoking status: Never Smoker  . Smokeless tobacco: Never Used  Substance Use Topics  . Alcohol use: Yes    Comment: occasionally  . Drug use: No     Family Hx: The patient's family history includes Atrial fibrillation in her mother; Colon cancer in her father and mother; Heart attack (age of onset: 5197) in her mother.   Prior CV studies:   The following studies were reviewed today: . Echo October 06, 2018: EF 60-65%. Gr 2 DD. MIld AI (improved). Ao Sclerosis - no stenosis.  . CTA chest ordered for July  Labs/Other Tests and Data Reviewed:    EKG:  No ECG reviewed.  Recent Labs: No results found for requested labs within last 8760 hours.   Recent Lipid Panel Lab Results  Component Value Date/Time   CHOL 192 07/07/2017 10:51 AM   TRIG 161 (H) 07/07/2017 10:51 AM   HDL 53 07/07/2017 10:51 AM   CHOLHDL 3.6 07/07/2017 10:51 AM   LDLCALC 107 (H) 07/07/2017 10:51 AM    Wt Readings from Last 3 Encounters:  10/19/18 165 lb (74.8 kg)  11/24/17 168 lb (76.2 kg)  08/10/17 163 lb  (73.9 kg)     Objective:    Vital Signs:  BP (!) 105/50   Pulse 65   Ht 5\' 6"  (1.676 m)   Wt 165 lb (74.8 kg)   SpO2 97%   BMI 26.63 kg/m   VITAL SIGNS:  reviewed GEN:  no acute distress RESPIRATORY:  Nonlabored CARDIOVASCULAR:  no peripheral edema NEURO:  alert and oriented x 3, no obvious focal deficit PSYCH:  normal affect  ASSESSMENT & PLAN:    Problem List Items Addressed This Visit    Thoracic aortic aneurysm (HCC) (Chronic)    Follow-up CT scan ordered for July by Dr. Laneta SimmersBartle.      Moderate aortic regurgitation (Chronic)    Echo shows only mild aortic insufficiency with no aortic stenosis.  Not exactly sure how to interpret this, but I think were probably okay holding off for another year to reassess.  Probably can check an echocardiogram  pending 2021 follow-up         COVID-19 Education: The signs and symptoms of COVID-19 were discussed with the patient and how to seek care for testing (follow up with PCP or arrange E-visit).   The importance of social distancing was discussed today.  Time:   Today, I have spent 15 minutes with the patient with telehealth technology discussing the above problems.     Medication Adjustments/Labs and Tests Ordered: Current medicines are reviewed at length with the patient today.  Concerns regarding medicines are outlined above.  Medication Instructions:   No changes  Tests Ordered: No orders of the defined types were placed in this encounter.  Hold off on Echo until after next year visit.  Medication Changes: No orders of the defined types were placed in this encounter.  none  Disposition:  Follow up in 1 year(s)    Signed, [name], MD  10/24/2018 5:06 PM    [facility]

## 2018-10-19 NOTE — Telephone Encounter (Signed)
SPOKE TO PATIENT INSTRUCTION GIVEN FROM TELEVISIT  10/19/18 . AVS SUMMARY WILL BE SENT VIA MYCHART. PATIENT VERBALIZED UNDERSTANDING

## 2018-10-24 ENCOUNTER — Encounter: Payer: Self-pay | Admitting: Cardiology

## 2018-10-24 NOTE — Assessment & Plan Note (Addendum)
Echo shows only mild aortic insufficiency with no aortic stenosis.  Not exactly sure how to interpret this, but I think were probably okay holding off for another year to reassess.  Probably can check an echocardiogram pending 2021 follow-up

## 2018-10-24 NOTE — Assessment & Plan Note (Signed)
Follow-up CT scan ordered for July by Dr. Cyndia Bent.

## 2018-11-14 ENCOUNTER — Other Ambulatory Visit: Payer: Self-pay | Admitting: Internal Medicine

## 2018-11-14 DIAGNOSIS — Z1231 Encounter for screening mammogram for malignant neoplasm of breast: Secondary | ICD-10-CM

## 2018-11-15 ENCOUNTER — Other Ambulatory Visit: Payer: Self-pay

## 2018-11-16 ENCOUNTER — Ambulatory Visit: Payer: Medicare Other | Admitting: Surgery

## 2018-11-16 ENCOUNTER — Encounter: Payer: Self-pay | Admitting: Surgery

## 2018-11-16 ENCOUNTER — Ambulatory Visit
Admission: RE | Admit: 2018-11-16 | Discharge: 2018-11-16 | Disposition: A | Discharge status: Home / Self Care | Payer: Medicare Other | Source: Ambulatory Visit | Attending: Surgery | Admitting: Surgery

## 2018-11-16 VITALS — BP 128/70 | HR 63 | Temp 97.7°F | Resp 16 | Ht 66.0 in | Wt 157.0 lb

## 2018-11-16 DIAGNOSIS — I712 Thoracic aortic aneurysm, without rupture, unspecified: Secondary | ICD-10-CM

## 2018-11-16 NOTE — Progress Notes (Signed)
HPI:  The patient returns today for follow-up of a 4.0 cm fusiform ascending aortic aneurysm which was noted on CT scan of the chest a couple years ago. She has history of fibromyalgia and polymyalgia rheumatica and has been treated with prednisone and methotrexate.  There is no family history of aneurysm, aortic dissection, or connective tissue disease.  She had an echocardiogram done in April 2019 which showed a trileaflet aortic valve with no stenosis but moderate regurgitation.  Her most recent echocardiogram in June 2020 only shows mild aortic insufficiency. She denies any chest pain or pressure.  She denies exertional fatigue and shortness of breath.  He has had no peripheral edema.  Current Outpatient Medications  Medication Sig Dispense Refill  . Cholecalciferol (VITAMIN D HIGH POTENCY) 1000 units capsule Take 1,000 Units by mouth 2 (two) times daily.    . cyanocobalamin (,VITAMIN B-12,) 1000 MCG/ML injection INJECT 1ML IM ONCE MONTHLY  1  . DULoxetine (CYMBALTA) 60 MG capsule Take 60 mg by mouth daily.     . folic acid (FOLVITE) 1 MG tablet Take 2 mg by mouth daily.  3  . LYRICA 100 MG capsule Take 100 mg by mouth 2 (two) times daily.  1  . methotrexate (RHEUMATREX) 2.5 MG tablet Take 3 tablets by mouth once a week.  3  . predniSONE (DELTASONE) 5 MG tablet Take 5 mg by mouth daily.      . traZODone (DESYREL) 50 MG tablet Take 50 mg by mouth at bedtime.       No current facility-administered medications for this visit.      Physical Exam: BP 128/70 (BP Location: Right Arm, Patient Position: Sitting, Cuff Size: Normal)   Pulse 63   Temp 97.7 F (36.5 C) Comment: THERMAL  Resp 16   Ht 5\' 6"  (1.676 m)   Wt 157 lb (71.2 kg)   SpO2 95% Comment: RA  BMI 25.34 kg/m  She looks well. Cardiac exam shows a regular rate and rhythm with normal heart sounds.  There is no murmur. Lungs are clear. There is no peripheral edema.  Diagnostic Tests:  CLINICAL DATA:  Follow-up thoracic  aortic aneurysm.  Given patient renal insufficiency chest CTA performed without intravenous contrast as done on previous CT scans performed 10/2017 and 08/2016.  EXAM: CT CHEST WITHOUT CONTRAST  TECHNIQUE: Multidetector CT imaging of the chest was performed following the standard protocol without IV contrast.  COMPARISON:  11/24/2017; 09/07/2016  FINDINGS: Vascular Findings:  Grossly unchanged mild fusiform ectasia of the ascending thoracic aorta with measurements as follows. The thoracic aorta tapers to a normal caliber at the level of the aortic arch.  Conventional configuration of the aortic arch.  Eccentric calcified atherosclerotic plaque is again noted involving the left undersurface of the aortic arch (coronal image 65, series 4). No evidence of an intramural hematoma and no definitive periaortic stranding. Intraluminal evaluation of the thoracic aorta is degraded secondary lack of intravenous contrast.  Borderline cardiomegaly. Trace amount of fluid is again seen with the pericardial recess, similar to the 08/2016 examination. No pericardial effusion.  Calcifications about the aortic valve leaflets.  -------------------------------------------------------------  Thoracic aortic measurements:  Sinotubular junction  32 mm as measured in greatest oblique short axis coronal dimension.  Proximal ascending aorta  39 mm as measured in greatest oblique short axis axial dimension at the level of the main pulmonary artery and approximately 39 mm in greatest oblique short axis coronal diameter (coronal image 53, series 4), grossly unchanged  compared to the 08/2016 examination.  Aortic arch aorta  27 mm as measured in greatest oblique short axis sagittal dimension.  Proximal descending thoracic aorta  26 mm as measured in greatest oblique short axis axial dimension at the level of the main pulmonary artery.  Distal descending thoracic aorta   23 mm as measured in greatest oblique short axis axial dimension at the level of the diaphragmatic hiatus.  Review of the MIP images confirms the above findings.  -------------------------------------------------------------  Non-Vascular Findings:  Mediastinum/Lymph Nodes: No bulky mediastinal, hilar axillary lymphadenopathy.  Lungs/Pleura: Minimal dependent subpleural ground-glass atelectasis. Minimal grossly symmetric biapical pleuroparenchymal thickening. No discrete focal airspace opacities. No pleural effusion or pneumothorax. The central pulmonary airways appear widely patent.  No discrete pulmonary nodules.  Upper abdomen: Limited noncontrast evaluation of the upper abdomen demonstrates a minimal amount of ill-defined stranding about the proximal aspect of the celiac artery (image 135, series 2), similar to the 08/2016 examination and presumably secondary to the adjacent small hiatal hernia.  Musculoskeletal: No acute or aggressive osseous abnormalities. Degenerative change of the lower cervical spine is suspected though incompletely evaluated. Regional soft tissues appear normal. Thyroid gland appears somewhat atrophic but without discrete nodule on this noncontrast examination.  IMPRESSION: 1. Stable uncomplicated fusiform ectasia of the ascending thoracic aorta measuring 39 mm in diameter, unchanged compared to the 08/2016 examination. 2.  Aortic Atherosclerosis (ICD10-I70.0). 3. Calcifications within the aortic valve leaflets as could be seen in the setting of aortic valvular disease. Further evaluation with cardiac echo could be performed as indicated pleural   Electronically Signed   By: Simonne ComeJohn  Watts M.D.   On: 11/16/2018 16:28   Impression:  This 73 year old woman has a stable 3.9 cm fusiform ascending aortic aneurysm.  This is still well below the 5.5 cm surgical threshold and has been stable over the past 2 years.  I reviewed the CTA  images with her and answered all of her questions.  I stressed the importance of good blood pressure control and preventing further enlargement and acute aortic dissection.  Her blood pressure today is well controlled.  I asked her not to do any heavy lifting of more than 35 pounds.  Since her aneurysm is small and stable I think is reasonable to follow this up again in 2 years.  Plan:  She will return to see me in 2 years with a CTA of the chest.  I spent 15 minutes performing this established patient evaluation and > 50% of this time was spent face to face counseling and coordinating the care of this patient's aortic aneurysm.    Alleen BorneBryan K Amandamarie Feggins, MD [facility] [phone]

## 2018-12-02 ENCOUNTER — Other Ambulatory Visit: Payer: Self-pay

## 2018-12-02 ENCOUNTER — Ambulatory Visit
Admission: RE | Admit: 2018-12-02 | Discharge: 2018-12-02 | Disposition: A | Discharge status: Home / Self Care | Payer: Medicare Other | Source: Ambulatory Visit | Attending: Internal Medicine | Admitting: Internal Medicine

## 2018-12-02 DIAGNOSIS — Z1231 Encounter for screening mammogram for malignant neoplasm of breast: Secondary | ICD-10-CM

## 2019-05-09 DIAGNOSIS — N390 Urinary tract infection, site not specified: Secondary | ICD-10-CM | POA: Diagnosis not present

## 2019-05-09 DIAGNOSIS — E559 Vitamin D deficiency, unspecified: Secondary | ICD-10-CM | POA: Diagnosis not present

## 2019-05-09 DIAGNOSIS — M1990 Unspecified osteoarthritis, unspecified site: Secondary | ICD-10-CM | POA: Diagnosis not present

## 2019-05-09 DIAGNOSIS — M810 Age-related osteoporosis without current pathological fracture: Secondary | ICD-10-CM | POA: Diagnosis not present

## 2019-05-09 DIAGNOSIS — E781 Pure hyperglyceridemia: Secondary | ICD-10-CM | POA: Diagnosis not present

## 2019-05-09 DIAGNOSIS — H04123 Dry eye syndrome of bilateral lacrimal glands: Secondary | ICD-10-CM | POA: Diagnosis not present

## 2019-05-09 DIAGNOSIS — Z961 Presence of intraocular lens: Secondary | ICD-10-CM | POA: Diagnosis not present

## 2019-05-09 DIAGNOSIS — Z0000 Encounter for general adult medical examination without abnormal findings: Secondary | ICD-10-CM | POA: Diagnosis not present

## 2019-05-09 DIAGNOSIS — H10503 Unspecified blepharoconjunctivitis, bilateral: Secondary | ICD-10-CM | POA: Diagnosis not present

## 2019-05-09 DIAGNOSIS — H11041 Peripheral pterygium, stationary, right eye: Secondary | ICD-10-CM | POA: Diagnosis not present

## 2019-05-22 DIAGNOSIS — H04123 Dry eye syndrome of bilateral lacrimal glands: Secondary | ICD-10-CM | POA: Diagnosis not present

## 2019-05-22 DIAGNOSIS — H11041 Peripheral pterygium, stationary, right eye: Secondary | ICD-10-CM | POA: Diagnosis not present

## 2019-05-22 DIAGNOSIS — H35373 Puckering of macula, bilateral: Secondary | ICD-10-CM | POA: Diagnosis not present

## 2019-05-22 DIAGNOSIS — H10503 Unspecified blepharoconjunctivitis, bilateral: Secondary | ICD-10-CM | POA: Diagnosis not present

## 2019-05-30 DIAGNOSIS — Z0000 Encounter for general adult medical examination without abnormal findings: Secondary | ICD-10-CM | POA: Diagnosis not present

## 2019-05-30 DIAGNOSIS — Z01419 Encounter for gynecological examination (general) (routine) without abnormal findings: Secondary | ICD-10-CM | POA: Diagnosis not present

## 2019-05-30 DIAGNOSIS — M810 Age-related osteoporosis without current pathological fracture: Secondary | ICD-10-CM | POA: Diagnosis not present

## 2019-05-30 DIAGNOSIS — M353 Polymyalgia rheumatica: Secondary | ICD-10-CM | POA: Diagnosis not present

## 2019-05-30 DIAGNOSIS — I351 Nonrheumatic aortic (valve) insufficiency: Secondary | ICD-10-CM | POA: Diagnosis not present

## 2019-05-30 DIAGNOSIS — I712 Thoracic aortic aneurysm, without rupture: Secondary | ICD-10-CM | POA: Diagnosis not present

## 2019-05-30 DIAGNOSIS — Z7952 Long term (current) use of systemic steroids: Secondary | ICD-10-CM | POA: Diagnosis not present

## 2019-06-01 DIAGNOSIS — H10503 Unspecified blepharoconjunctivitis, bilateral: Secondary | ICD-10-CM | POA: Diagnosis not present

## 2019-06-01 DIAGNOSIS — H11041 Peripheral pterygium, stationary, right eye: Secondary | ICD-10-CM | POA: Diagnosis not present

## 2019-06-01 DIAGNOSIS — H35373 Puckering of macula, bilateral: Secondary | ICD-10-CM | POA: Diagnosis not present

## 2019-06-01 DIAGNOSIS — H04123 Dry eye syndrome of bilateral lacrimal glands: Secondary | ICD-10-CM | POA: Diagnosis not present

## 2019-06-04 NOTE — Progress Notes (Signed)
Labs from May 09, 2019 Na+ 144, K+ 5.0, Cl- 103, HCO3-31, BUN 20, Cr 0.89, Glu 80, Ca2+ 9.3; AST 24, ALT 29, AlkP 94 CBC: W 5.6, H/H 13.9/45.1, Plt 20.3 TC 193, TG 104, HDL 71, LDL 104 --> compared to 168, 104, 68, 87 in January 2020  Overall relatively stable labs.  Kidney function is okay.  Cholesterol levels not as good as last year, but still relatively okay.  Would like to see LDL less than 100. Would probably recommend testing again in July, and if LDL does not improve, may want to consider statin.  Bryan Lemma, MD

## 2019-06-13 DIAGNOSIS — H11423 Conjunctival edema, bilateral: Secondary | ICD-10-CM | POA: Diagnosis not present

## 2019-06-16 DIAGNOSIS — H11423 Conjunctival edema, bilateral: Secondary | ICD-10-CM | POA: Diagnosis not present

## 2019-07-10 DIAGNOSIS — M797 Fibromyalgia: Secondary | ICD-10-CM | POA: Diagnosis not present

## 2019-07-10 DIAGNOSIS — Z79899 Other long term (current) drug therapy: Secondary | ICD-10-CM | POA: Diagnosis not present

## 2019-07-10 DIAGNOSIS — M064 Inflammatory polyarthropathy: Secondary | ICD-10-CM | POA: Diagnosis not present

## 2019-07-10 DIAGNOSIS — M810 Age-related osteoporosis without current pathological fracture: Secondary | ICD-10-CM | POA: Diagnosis not present

## 2019-07-10 DIAGNOSIS — M542 Cervicalgia: Secondary | ICD-10-CM | POA: Diagnosis not present

## 2019-07-10 DIAGNOSIS — M1990 Unspecified osteoarthritis, unspecified site: Secondary | ICD-10-CM | POA: Diagnosis not present

## 2019-07-10 DIAGNOSIS — M353 Polymyalgia rheumatica: Secondary | ICD-10-CM | POA: Diagnosis not present

## 2019-07-10 DIAGNOSIS — M549 Dorsalgia, unspecified: Secondary | ICD-10-CM | POA: Diagnosis not present

## 2019-07-10 DIAGNOSIS — M7060 Trochanteric bursitis, unspecified hip: Secondary | ICD-10-CM | POA: Diagnosis not present

## 2019-07-13 DIAGNOSIS — H35373 Puckering of macula, bilateral: Secondary | ICD-10-CM | POA: Diagnosis not present

## 2019-07-13 DIAGNOSIS — Z961 Presence of intraocular lens: Secondary | ICD-10-CM | POA: Diagnosis not present

## 2019-07-13 DIAGNOSIS — H11041 Peripheral pterygium, stationary, right eye: Secondary | ICD-10-CM | POA: Diagnosis not present

## 2019-07-13 DIAGNOSIS — H10503 Unspecified blepharoconjunctivitis, bilateral: Secondary | ICD-10-CM | POA: Diagnosis not present

## 2019-08-03 DIAGNOSIS — M810 Age-related osteoporosis without current pathological fracture: Secondary | ICD-10-CM | POA: Diagnosis not present

## 2019-10-23 DIAGNOSIS — M1990 Unspecified osteoarthritis, unspecified site: Secondary | ICD-10-CM | POA: Diagnosis not present

## 2019-10-23 DIAGNOSIS — Z79899 Other long term (current) drug therapy: Secondary | ICD-10-CM | POA: Diagnosis not present

## 2019-10-23 DIAGNOSIS — M810 Age-related osteoporosis without current pathological fracture: Secondary | ICD-10-CM | POA: Diagnosis not present

## 2019-10-23 DIAGNOSIS — M549 Dorsalgia, unspecified: Secondary | ICD-10-CM | POA: Diagnosis not present

## 2019-10-23 DIAGNOSIS — M353 Polymyalgia rheumatica: Secondary | ICD-10-CM | POA: Diagnosis not present

## 2019-10-23 DIAGNOSIS — M0600 Rheumatoid arthritis without rheumatoid factor, unspecified site: Secondary | ICD-10-CM | POA: Diagnosis not present

## 2019-10-23 DIAGNOSIS — M797 Fibromyalgia: Secondary | ICD-10-CM | POA: Diagnosis not present

## 2019-10-23 DIAGNOSIS — M542 Cervicalgia: Secondary | ICD-10-CM | POA: Diagnosis not present

## 2019-10-26 ENCOUNTER — Telehealth: Payer: Self-pay | Admitting: *Deleted

## 2019-10-26 NOTE — Telephone Encounter (Signed)
A message was left, re: her follow up visit. 

## 2019-11-20 ENCOUNTER — Other Ambulatory Visit: Payer: Self-pay | Admitting: Internal Medicine

## 2019-11-20 DIAGNOSIS — Z1231 Encounter for screening mammogram for malignant neoplasm of breast: Secondary | ICD-10-CM

## 2019-11-22 ENCOUNTER — Encounter: Payer: Medicare Other | Admitting: Surgery

## 2019-12-04 ENCOUNTER — Ambulatory Visit (INDEPENDENT_AMBULATORY_CARE_PROVIDER_SITE_OTHER): Payer: Medicare PPO | Admitting: Cardiology

## 2019-12-04 ENCOUNTER — Encounter: Payer: Self-pay | Admitting: Cardiology

## 2019-12-04 ENCOUNTER — Other Ambulatory Visit: Payer: Self-pay

## 2019-12-04 VITALS — BP 122/70 | HR 57 | Ht 66.0 in | Wt 162.8 lb

## 2019-12-04 DIAGNOSIS — I712 Thoracic aortic aneurysm, without rupture, unspecified: Secondary | ICD-10-CM

## 2019-12-04 DIAGNOSIS — I351 Nonrheumatic aortic (valve) insufficiency: Secondary | ICD-10-CM | POA: Diagnosis not present

## 2019-12-04 NOTE — Patient Instructions (Signed)
Medication Instructions:  No changes  *If you need a refill on your cardiac medications before your next appointment, please call your pharmacy*   Lab Work: Not needed    Testing/Procedures:  You will be schedule for a CT for July 2022.,if you do not here about a date for July by May 2022 , please call Dr Laneta Simmers 's office   Follow-Up: At [facility], you and your health needs are our priority.  As part of our continuing mission to provide you with exceptional heart care, we have created designated Provider Care Teams.  These Care Teams include your primary Cardiologist (physician) and Advanced Practice Providers (APPs -  Physician Assistants and Nurse Practitioners) who all work together to provide you with the care you need, when you need it.     Your next appointment:   12 month(s) aug 2022  The format for your next appointment:   In Person  Provider:   Bryan Lemma, MD   Other Instructions Keep walking

## 2019-12-04 NOTE — Progress Notes (Signed)
Primary Care Provider: Merri BrunettePharr, Walter, MD Cardiologist: No primary care provider on file. Electrophysiologist: None  Clinic Note: Chief Complaint  Patient presents with  . Follow-up    Delayed annual  . Cardiac Valve Problem    Aortic regurgitation-mild by last echo   HPI:    Andrea IvanoffBarbara B Mata is a 74 y.o. female with a PMH notable for combination of FIBROMYALGIA and POLYMYALGIA RHEUMATICA along with MODERATE AORTIC REGURGITATION and THORACIC ASCENDING AORTIC ANEURYSM who presents today for delayed annual follow-up.  Andrea Mata was last seen on October 19, 2018 via telemedicine.  She was doing quite well just had issues with chronic fatigue and myalgias/arthralgias from her underlying conditions.  No chest pain or dyspnea with rest exertion.  Just deconditioned her lack of exercise.  Short of breath if she overdoes it.  Otherwise no arrhythmia symptoms or chest pain, syncope/near syncope..  Plan was to check follow-up echocardiogram and then wait evaluation by cardiac surgery.  Marland Kitchen. She was seen by Dr. Evelene CroonBryan Bartle from [facility] on November 16, 2018.  He indicated that she is well below the 5.5 cm surgical threshold and has been stable.  Discussed importance of blood pressure control.  Plan was CTA Aorta evaluation 2 years.   Recent Hospitalizations: None  Reviewed  CV studies:    The following studies were reviewed today: (if available, images/films reviewed: From Epic Chart or Care Everywhere) . Echo:- June 2020: EF 60 to 65%.  Normal LV size.  No R WMA.  GR 2 DD moderate aortic valve thickening with mild aortic regurgitation normal aortic root size mild ascending aortic dilation. . CTA Chest-Aorta 11/16/2018: Stable fusiform "ectasia "of ascending aorta measuring 39 mm diameter-unchanged compared to 2018.  Aortic atherosclerosis.  Aortic valve calcification noted.  Minimal biapical pleural parenchymal thickening o Aortic arch 27 mm increase oblique axis, 26 mm proximal descending thoracic  aorta and 23 mm distal descending thoracic aorta at diaphragmatic hiatus.  No evidence of intramural hematoma or dissection  Interval History:   Andrea Mata returns here today overall doing fairly well.  She still does not do very much because of her underlying illnesses, but really denies any significant symptoms of any exertional dyspnea.  She is quite deconditioned.  She denies any chest pain or pressure with rest or exertion.  Her blood pressures been pretty well controlled.  Denies any heart failure symptoms of PND, orthopnea or edema.  No anginal symptoms of chest pain or pressure with rest or exertion.  Back in 2018, she had significant exercise intolerance, dyspnea and chest discomfort.  She is not having the symptoms now.  I suspect that her symptoms are more related to PMR.  CV Review of Symptoms (Summary) Cardiovascular ROS: positive for - dyspnea on exertion, edema and Dyspnea is more related to deconditioning, and edema is mild on the day negative for - chest pain, irregular heartbeat, orthopnea, palpitations, paroxysmal nocturnal dyspnea, rapid heart rate, shortness of breath or Lightheadedness or dizziness, syncope/near syncope or TIA/amaurosis fugax.  Claudication  The patient does not have symptoms concerning for COVID-19 infection (fever, chills, cough, or new shortness of breath).  The patient is practicing social distancing & Masking.    REVIEWED OF SYSTEMS   Review of Systems  Constitutional: Positive for malaise/fatigue (Feels tired). Negative for weight loss.  HENT: Negative for congestion and nosebleeds.   Respiratory: Negative for shortness of breath.   Cardiovascular: Negative for claudication and leg swelling.  Per HPI  Gastrointestinal: Negative for blood in stool and melena.  Genitourinary: Negative for dysuria, frequency and hematuria.  Musculoskeletal: Positive for joint pain and myalgias. Negative for back pain and falls.  Neurological: Positive for  weakness (Upper arm muscles do feel weak with PMR). Negative for dizziness and focal weakness.  Psychiatric/Behavioral: Negative for depression and memory loss. The patient is nervous/anxious (She can have spells of anxiety). The patient does not have insomnia.    I have reviewed and (if needed) personally updated the patient's problem list, medications, allergies, past medical and surgical history, social and family history.   PAST MEDICAL HISTORY   Past Medical History:  Diagnosis Date  . Anemia   . Constipation   . Fibromyalgia 2006  . GERD (gastroesophageal reflux disease)   . History of shingles    mid thoracic extending to front of right side of chest  . Hyperlipidemia   . Osteopenia   . Polymyalgia rheumatica (HCC) 2005    PAST SURGICAL HISTORY   Past Surgical History:  Procedure Laterality Date  . ABDOMINAL HYSTERECTOMY  1982   Partial hysterectomy with removal of her left ovary  . adenomatous polyps    . TRANSTHORACIC ECHOCARDIOGRAM  09/2018   Echo: EF 60 to 65%.  Normal LV size.  No R WMA.  GR 2 DD moderate aortic valve thickening with mild aortic regurgitation normal aortic root size mild ascending aortic dilation.    MEDICATIONS/ALLERGIES   Current Meds  Medication Sig  . Cholecalciferol (VITAMIN D HIGH POTENCY) 1000 units capsule Take 1,000 Units by mouth 2 (two) times daily.  . cyanocobalamin (,VITAMIN B-12,) 1000 MCG/ML injection INJECT IM ONCE MONTHLY  . DULoxetine (CYMBALTA) 60 MG capsule Take 60 mg by mouth daily.   . folic acid (FOLVITE) 1 MG tablet Take 2 mg by mouth daily.  Marland Kitchen LYRICA 100 MG capsule Take 100 mg by mouth 2 (two) times daily.  . methotrexate (RHEUMATREX) 2.5 MG tablet Take 6 tablets by mouth once a week.   . predniSONE (DELTASONE) 5 MG tablet Take 5 mg by mouth daily.    . traZODone (DESYREL) 50 MG tablet Take 50 mg by mouth at bedtime.      No Known Allergies  SOCIAL HISTORY/FAMILY HISTORY   Reviewed in Epic:  Pertinent  findings: n/a  OBJCTIVE -PE, EKG, labs   Wt Readings from Last 3 Encounters:  12/04/19 162 lb 12.8 oz (73.8 kg)  11/16/18 157 lb (71.2 kg)  10/19/18 165 lb (74.8 kg)    Physical Exam: BP 122/70   Pulse (!) 57   Ht 5\' 6"  (1.676 m)   Wt 162 lb 12.8 oz (73.8 kg)   BMI 26.28 kg/m  Physical Exam Constitutional:      General: She is not in acute distress.    Appearance: Normal appearance. She is normal weight. She is not ill-appearing.     Comments: Healthy-appearing.  Well-groomed  HENT:     Head: Normocephalic and atraumatic.  Neck:     Vascular: No carotid bruit.     Comments: No JVD or HJR Cardiovascular:     Rate and Rhythm: Normal rate and regular rhythm.     Pulses: Normal pulses.     Heart sounds: Murmur (~1/6 SEM at RUSB.) heard.  No friction rub. No gallop.      Comments: Normal S1 and split S2.  No ectopy.  Nondisplaced MI Pulmonary:     Effort: Pulmonary effort is normal. No respiratory distress.  Breath sounds: Normal breath sounds. No wheezing.  Chest:     Chest wall: Tenderness (Tenderness palpation along the sternal borders.) present.  Musculoskeletal:        General: No swelling. Normal range of motion.     Cervical back: Normal range of motion and neck supple.  Neurological:     General: No focal deficit present.     Mental Status: She is alert and oriented to person, place, and time.  Psychiatric:        Mood and Affect: Mood normal.        Behavior: Behavior normal.        Thought Content: Thought content normal.        Judgment: Judgment normal.     Comments: She has a somewhat timid in the most anxious nature       Adult ECG Report  Rate: 57;  Rhythm: sinus bradycardia and RBBB.  Otherwise normal axis, intervals and durations;   Narrative Interpretation: Stable EKG.  Recent Labs: May 09, 2019: TC 193, TG 113, HDL 71, LDL 104.  Hgb 13.6;Cr 0.89, K+ 4.3, TSH 2.03.  Mag 2.2, ALT 29, AST 24, Ca2+9.2 Lab Results  Component Value Date    CHOL 192 07/07/2017   HDL 53 07/07/2017   LDLCALC 107 (H) 07/07/2017   TRIG 161 (H) 07/07/2017   CHOLHDL 3.6 07/07/2017   Lab Results  Component Value Date   CREATININE 0.93 07/07/2017   BUN 17 07/07/2017   NA 143 07/07/2017   K 4.3 07/07/2017   CL 101 07/07/2017   CO2 28 07/07/2017   No results found for: TSH  ASSESSMENT/PLAN    Problem List Items Addressed This Visit    Moderate aortic regurgitation - Primary (Chronic)    Patient echo did not show moderate aortic regurgitation.  Effect was only mild.  I suspect some of this is functional.  Recommendation would be continued blood pressure management.  She is not currently on any medications.  We will follow for signs or symptoms of worsening heart failure/dyspnea or chest pain.  Based on last echo, probably do not need to follow-up every year.  Can check an echo every 2 to 3 years.  Plan for Dr. Laneta Simmers was to check CTA of the chest next year which should be 2022.  As such, we can wait till 2023 to check an echocardiogram and potentially check alternating every 2 to 3 years.      Relevant Orders   EKG 12-Lead (Completed)   Thoracic aortic aneurysm (HCC) (Chronic)    3.9 to 4.0 cm thoracic aorta.  Well below the concerning number diameter of 5.5 cm. She does not really have any significant risk factors besides some mild aortic valve regurgitation.  Plan is to recheck CTA Aorta next year in July.  We will see her back after      Relevant Orders   EKG 12-Lead (Completed)       COVID-19 Education: The signs and symptoms of COVID-19 were discussed with the patient and how to seek care for testing (follow up with PCP or arrange E-visit).   The importance of social distancing and COVID-19 vaccination was discussed today.  I spent a total of with the patient. >  50% of the time was spent in direct patient consultation.  Additional time spent with chart review  / charting (studies, outside notes, etc): 6 Total Time:  22 min   Current medicines are reviewed at length with the patient today.  (+/-  concerns) none  Notice: This dictation was prepared with Dragon dictation along with smaller phrase technology. Any transcriptional errors that result from this process are unintentional and may not be corrected upon review.  Patient Instructions / Medication Changes & Studies & Tests Ordered   Patient Instructions  Medication Instructions:  No changes  *If you need a refill on your cardiac medications before your next appointment, please call your pharmacy*   Lab Work: Not needed    Testing/Procedures:  You will be schedule for a CT for July 2022.,if you do not here about a date for July by May 2022 , please call Dr Laneta Simmers 's office   Follow-Up: At [facility], you and your health needs are our priority.  As part of our continuing mission to provide you with exceptional heart care, we have created designated Provider Care Teams.  These Care Teams include your primary Cardiologist (physician) and Advanced Practice Providers (APPs -  Physician Assistants and Nurse Practitioners) who all work together to provide you with the care you need, when you need it.     Your next appointment:   12 month(s) aug 2022  The format for your next appointment:   In Person  Provider:   Bryan Lemma, MD   Other Instructions Keep walking     Studies Ordered:   Orders Placed This Encounter  Procedures  . EKG 12-Lead     Bryan Lemma, M.D., M.S. Interventional Cardiologist   Pager # [phone] Phone # [phone] [address] [place], [place] [address]   Thank you for choosing Heartcare at [facility]!!

## 2019-12-06 ENCOUNTER — Encounter: Payer: Self-pay | Admitting: Cardiology

## 2019-12-06 NOTE — Assessment & Plan Note (Signed)
3.9 to 4.0 cm thoracic aorta.  Well below the concerning number diameter of 5.5 cm. She does not really have any significant risk factors besides some mild aortic valve regurgitation.  Plan is to recheck CTA Aorta next year in July.  We will see her back after

## 2019-12-06 NOTE — Assessment & Plan Note (Addendum)
Patient echo did not show moderate aortic regurgitation.  Effect was only mild.  I suspect some of this is functional.  Recommendation would be continued blood pressure management.  She is not currently on any medications.  We will follow for signs or symptoms of worsening heart failure/dyspnea or chest pain.  Based on last echo, probably do not need to follow-up every year.  Can check an echo every 2 to 3 years.  Plan for Dr. Laneta Simmers was to check CTA of the chest next year which should be 2022.  As such, we can wait till 2023 to check an echocardiogram and potentially check alternating every 2 to 3 years.

## 2019-12-12 ENCOUNTER — Ambulatory Visit
Admission: RE | Admit: 2019-12-12 | Discharge: 2019-12-12 | Disposition: A | Discharge status: Home / Self Care | Payer: Medicare PPO | Source: Ambulatory Visit | Attending: Internal Medicine | Admitting: Internal Medicine

## 2019-12-12 ENCOUNTER — Other Ambulatory Visit: Payer: Self-pay

## 2019-12-12 DIAGNOSIS — Z1231 Encounter for screening mammogram for malignant neoplasm of breast: Secondary | ICD-10-CM

## 2020-01-25 DIAGNOSIS — Z79899 Other long term (current) drug therapy: Secondary | ICD-10-CM | POA: Diagnosis not present

## 2020-01-25 DIAGNOSIS — M797 Fibromyalgia: Secondary | ICD-10-CM | POA: Diagnosis not present

## 2020-01-25 DIAGNOSIS — M1990 Unspecified osteoarthritis, unspecified site: Secondary | ICD-10-CM | POA: Diagnosis not present

## 2020-01-25 DIAGNOSIS — M549 Dorsalgia, unspecified: Secondary | ICD-10-CM | POA: Diagnosis not present

## 2020-01-25 DIAGNOSIS — M0600 Rheumatoid arthritis without rheumatoid factor, unspecified site: Secondary | ICD-10-CM | POA: Diagnosis not present

## 2020-01-25 DIAGNOSIS — M810 Age-related osteoporosis without current pathological fracture: Secondary | ICD-10-CM | POA: Diagnosis not present

## 2020-01-25 DIAGNOSIS — M542 Cervicalgia: Secondary | ICD-10-CM | POA: Diagnosis not present

## 2020-01-25 DIAGNOSIS — M353 Polymyalgia rheumatica: Secondary | ICD-10-CM | POA: Diagnosis not present

## 2020-02-05 DIAGNOSIS — M810 Age-related osteoporosis without current pathological fracture: Secondary | ICD-10-CM | POA: Diagnosis not present

## 2020-02-12 ENCOUNTER — Other Ambulatory Visit: Payer: Self-pay

## 2020-02-12 ENCOUNTER — Ambulatory Visit
Admission: RE | Admit: 2020-02-12 | Discharge: 2020-02-12 | Disposition: A | Discharge status: Home / Self Care | Payer: Medicare PPO | Source: Ambulatory Visit | Attending: Internal Medicine | Admitting: Internal Medicine

## 2020-02-12 DIAGNOSIS — Z1231 Encounter for screening mammogram for malignant neoplasm of breast: Secondary | ICD-10-CM

## 2020-04-17 DIAGNOSIS — M1990 Unspecified osteoarthritis, unspecified site: Secondary | ICD-10-CM | POA: Diagnosis not present

## 2020-04-17 DIAGNOSIS — M549 Dorsalgia, unspecified: Secondary | ICD-10-CM | POA: Diagnosis not present

## 2020-04-17 DIAGNOSIS — M810 Age-related osteoporosis without current pathological fracture: Secondary | ICD-10-CM | POA: Diagnosis not present

## 2020-04-17 DIAGNOSIS — M0600 Rheumatoid arthritis without rheumatoid factor, unspecified site: Secondary | ICD-10-CM | POA: Diagnosis not present

## 2020-04-17 DIAGNOSIS — Z23 Encounter for immunization: Secondary | ICD-10-CM | POA: Diagnosis not present

## 2020-04-17 DIAGNOSIS — M353 Polymyalgia rheumatica: Secondary | ICD-10-CM | POA: Diagnosis not present

## 2020-04-17 DIAGNOSIS — M797 Fibromyalgia: Secondary | ICD-10-CM | POA: Diagnosis not present

## 2020-04-17 DIAGNOSIS — M542 Cervicalgia: Secondary | ICD-10-CM | POA: Diagnosis not present

## 2020-04-17 DIAGNOSIS — Z79899 Other long term (current) drug therapy: Secondary | ICD-10-CM | POA: Diagnosis not present

## 2020-05-29 DIAGNOSIS — M797 Fibromyalgia: Secondary | ICD-10-CM | POA: Diagnosis not present

## 2020-05-29 DIAGNOSIS — E781 Pure hyperglyceridemia: Secondary | ICD-10-CM | POA: Diagnosis not present

## 2020-05-29 DIAGNOSIS — E559 Vitamin D deficiency, unspecified: Secondary | ICD-10-CM | POA: Diagnosis not present

## 2020-05-29 DIAGNOSIS — Z79899 Other long term (current) drug therapy: Secondary | ICD-10-CM | POA: Diagnosis not present

## 2020-05-29 DIAGNOSIS — N390 Urinary tract infection, site not specified: Secondary | ICD-10-CM | POA: Diagnosis not present

## 2020-05-29 DIAGNOSIS — E785 Hyperlipidemia, unspecified: Secondary | ICD-10-CM | POA: Diagnosis not present

## 2020-05-29 DIAGNOSIS — M353 Polymyalgia rheumatica: Secondary | ICD-10-CM | POA: Diagnosis not present

## 2020-05-29 DIAGNOSIS — M1990 Unspecified osteoarthritis, unspecified site: Secondary | ICD-10-CM | POA: Diagnosis not present

## 2020-05-29 DIAGNOSIS — E538 Deficiency of other specified B group vitamins: Secondary | ICD-10-CM | POA: Diagnosis not present

## 2020-05-29 DIAGNOSIS — M810 Age-related osteoporosis without current pathological fracture: Secondary | ICD-10-CM | POA: Diagnosis not present

## 2020-05-29 DIAGNOSIS — N182 Chronic kidney disease, stage 2 (mild): Secondary | ICD-10-CM | POA: Diagnosis not present

## 2020-06-03 DIAGNOSIS — T1512XA Foreign body in conjunctival sac, left eye, initial encounter: Secondary | ICD-10-CM | POA: Diagnosis not present

## 2020-06-03 DIAGNOSIS — H04123 Dry eye syndrome of bilateral lacrimal glands: Secondary | ICD-10-CM | POA: Diagnosis not present

## 2020-06-05 DIAGNOSIS — Z01419 Encounter for gynecological examination (general) (routine) without abnormal findings: Secondary | ICD-10-CM | POA: Diagnosis not present

## 2020-06-05 DIAGNOSIS — Z0000 Encounter for general adult medical examination without abnormal findings: Secondary | ICD-10-CM | POA: Diagnosis not present

## 2020-06-05 DIAGNOSIS — I712 Thoracic aortic aneurysm, without rupture: Secondary | ICD-10-CM | POA: Diagnosis not present

## 2020-06-05 DIAGNOSIS — E538 Deficiency of other specified B group vitamins: Secondary | ICD-10-CM | POA: Diagnosis not present

## 2020-06-05 DIAGNOSIS — E559 Vitamin D deficiency, unspecified: Secondary | ICD-10-CM | POA: Diagnosis not present

## 2020-07-18 DIAGNOSIS — Z79899 Other long term (current) drug therapy: Secondary | ICD-10-CM | POA: Diagnosis not present

## 2020-07-18 DIAGNOSIS — M542 Cervicalgia: Secondary | ICD-10-CM | POA: Diagnosis not present

## 2020-07-18 DIAGNOSIS — M810 Age-related osteoporosis without current pathological fracture: Secondary | ICD-10-CM | POA: Diagnosis not present

## 2020-07-18 DIAGNOSIS — M797 Fibromyalgia: Secondary | ICD-10-CM | POA: Diagnosis not present

## 2020-07-18 DIAGNOSIS — M1990 Unspecified osteoarthritis, unspecified site: Secondary | ICD-10-CM | POA: Diagnosis not present

## 2020-07-18 DIAGNOSIS — M0600 Rheumatoid arthritis without rheumatoid factor, unspecified site: Secondary | ICD-10-CM | POA: Diagnosis not present

## 2020-07-18 DIAGNOSIS — G4700 Insomnia, unspecified: Secondary | ICD-10-CM | POA: Diagnosis not present

## 2020-07-18 DIAGNOSIS — M353 Polymyalgia rheumatica: Secondary | ICD-10-CM | POA: Diagnosis not present

## 2020-07-18 DIAGNOSIS — M549 Dorsalgia, unspecified: Secondary | ICD-10-CM | POA: Diagnosis not present

## 2020-07-31 DIAGNOSIS — H11041 Peripheral pterygium, stationary, right eye: Secondary | ICD-10-CM | POA: Diagnosis not present

## 2020-07-31 DIAGNOSIS — Z961 Presence of intraocular lens: Secondary | ICD-10-CM | POA: Diagnosis not present

## 2020-07-31 DIAGNOSIS — H35373 Puckering of macula, bilateral: Secondary | ICD-10-CM | POA: Diagnosis not present

## 2020-07-31 DIAGNOSIS — H04123 Dry eye syndrome of bilateral lacrimal glands: Secondary | ICD-10-CM | POA: Diagnosis not present

## 2020-08-08 DIAGNOSIS — M810 Age-related osteoporosis without current pathological fracture: Secondary | ICD-10-CM | POA: Diagnosis not present

## 2020-08-15 DIAGNOSIS — M542 Cervicalgia: Secondary | ICD-10-CM | POA: Diagnosis not present

## 2020-08-15 DIAGNOSIS — K5909 Other constipation: Secondary | ICD-10-CM | POA: Diagnosis not present

## 2020-08-21 DIAGNOSIS — W540XXA Bitten by dog, initial encounter: Secondary | ICD-10-CM | POA: Diagnosis not present

## 2020-08-21 DIAGNOSIS — S61451A Open bite of right hand, initial encounter: Secondary | ICD-10-CM | POA: Diagnosis not present

## 2020-08-23 DIAGNOSIS — S61451D Open bite of right hand, subsequent encounter: Secondary | ICD-10-CM | POA: Diagnosis not present

## 2020-08-23 DIAGNOSIS — W540XXD Bitten by dog, subsequent encounter: Secondary | ICD-10-CM | POA: Diagnosis not present

## 2020-08-27 DIAGNOSIS — S61459D Open bite of unspecified hand, subsequent encounter: Secondary | ICD-10-CM | POA: Diagnosis not present

## 2020-08-27 DIAGNOSIS — W540XXD Bitten by dog, subsequent encounter: Secondary | ICD-10-CM | POA: Diagnosis not present

## 2020-08-30 DIAGNOSIS — W540XXD Bitten by dog, subsequent encounter: Secondary | ICD-10-CM | POA: Diagnosis not present

## 2020-08-30 DIAGNOSIS — S61459D Open bite of unspecified hand, subsequent encounter: Secondary | ICD-10-CM | POA: Diagnosis not present

## 2020-10-03 ENCOUNTER — Other Ambulatory Visit: Payer: Self-pay | Admitting: Surgery

## 2020-10-03 DIAGNOSIS — I712 Thoracic aortic aneurysm, without rupture, unspecified: Secondary | ICD-10-CM

## 2020-10-17 DIAGNOSIS — M549 Dorsalgia, unspecified: Secondary | ICD-10-CM | POA: Diagnosis not present

## 2020-10-17 DIAGNOSIS — M542 Cervicalgia: Secondary | ICD-10-CM | POA: Diagnosis not present

## 2020-10-17 DIAGNOSIS — M797 Fibromyalgia: Secondary | ICD-10-CM | POA: Diagnosis not present

## 2020-10-17 DIAGNOSIS — M1990 Unspecified osteoarthritis, unspecified site: Secondary | ICD-10-CM | POA: Diagnosis not present

## 2020-10-17 DIAGNOSIS — G4700 Insomnia, unspecified: Secondary | ICD-10-CM | POA: Diagnosis not present

## 2020-10-17 DIAGNOSIS — Z79899 Other long term (current) drug therapy: Secondary | ICD-10-CM | POA: Diagnosis not present

## 2020-10-17 DIAGNOSIS — M810 Age-related osteoporosis without current pathological fracture: Secondary | ICD-10-CM | POA: Diagnosis not present

## 2020-10-17 DIAGNOSIS — M353 Polymyalgia rheumatica: Secondary | ICD-10-CM | POA: Diagnosis not present

## 2020-10-17 DIAGNOSIS — M0600 Rheumatoid arthritis without rheumatoid factor, unspecified site: Secondary | ICD-10-CM | POA: Diagnosis not present

## 2020-11-06 DIAGNOSIS — M7731 Calcaneal spur, right foot: Secondary | ICD-10-CM | POA: Diagnosis not present

## 2020-11-06 DIAGNOSIS — G5762 Lesion of plantar nerve, left lower limb: Secondary | ICD-10-CM | POA: Diagnosis not present

## 2020-11-06 DIAGNOSIS — M65871 Other synovitis and tenosynovitis, right ankle and foot: Secondary | ICD-10-CM | POA: Diagnosis not present

## 2020-11-06 DIAGNOSIS — G5761 Lesion of plantar nerve, right lower limb: Secondary | ICD-10-CM | POA: Diagnosis not present

## 2020-11-06 DIAGNOSIS — M65872 Other synovitis and tenosynovitis, left ankle and foot: Secondary | ICD-10-CM | POA: Diagnosis not present

## 2020-11-06 DIAGNOSIS — M7732 Calcaneal spur, left foot: Secondary | ICD-10-CM | POA: Diagnosis not present

## 2020-11-13 ENCOUNTER — Ambulatory Visit: Payer: Medicare PPO | Admitting: Surgery

## 2020-11-13 ENCOUNTER — Other Ambulatory Visit: Payer: Self-pay

## 2020-11-13 ENCOUNTER — Encounter: Payer: Self-pay | Admitting: Surgery

## 2020-11-13 ENCOUNTER — Ambulatory Visit
Admission: RE | Admit: 2020-11-13 | Discharge: 2020-11-13 | Disposition: A | Discharge status: Home / Self Care | Payer: Medicare PPO | Source: Ambulatory Visit | Attending: Surgery | Admitting: Surgery

## 2020-11-13 VITALS — BP 147/67 | HR 65 | Resp 20 | Wt 159.0 lb

## 2020-11-13 DIAGNOSIS — I712 Thoracic aortic aneurysm, without rupture, unspecified: Secondary | ICD-10-CM

## 2020-11-13 MED ORDER — IOPAMIDOL (ISOVUE-370) INJECTION 76%
75.0000 mL | Freq: Once | INTRAVENOUS | Status: AC | PRN
  Administered 2020-11-13: 75 mL via INTRAVENOUS

## 2020-11-13 NOTE — Progress Notes (Signed)
HPI:  The patient is a 75 year old woman who returns for follow-up of a stable 3.9 cm fusiform ascending aortic aneurysm.  Since I last saw her 2 years ago she has been feeling well without any change to her medical history.  She denies any chest or back pain or shortness of breath.  Current Outpatient Medications  Medication Sig Dispense Refill   Cholecalciferol 25 MCG (1000 UT) capsule Take 1,000 Units by mouth 2 (two) times daily.     cyanocobalamin (,VITAMIN B-12,) 1000 MCG/ML injection INJECT IM ONCE MONTHLY  1   DULoxetine (CYMBALTA) 60 MG capsule Take 60 mg by mouth daily.      folic acid (FOLVITE) 1 MG tablet Take 2 mg by mouth daily.  3   LYRICA 100 MG capsule Take 100 mg by mouth 2 (two) times daily.  1   methotrexate (RHEUMATREX) 2.5 MG tablet Take 6 tablets by mouth once a week.   3   predniSONE (DELTASONE) 5 MG tablet Take 5 mg by mouth daily.       traZODone (DESYREL) 50 MG tablet Take 50 mg by mouth at bedtime.       No current facility-administered medications for this visit.     Physical Exam: BP (!) 147/67 (BP Location: Right Arm, Patient Position: Sitting)   Pulse 65   Resp 20   Wt 159 lb (72.1 kg)   SpO2 95% Comment: RA  BMI 25.66 kg/m  She looks well. Cardiac exam shows regular rate and rhythm with normal heart sounds.  There is no murmur. Lungs are clear  Diagnostic Tests:  Narrative & Impression  CLINICAL DATA:  Thoracic aortic aneurysm follow-up   EXAM: CT ANGIOGRAPHY CHEST WITH CONTRAST   TECHNIQUE: Multidetector CT imaging of the chest was performed using the standard protocol during bolus administration of intravenous contrast. Multiplanar CT image reconstructions and MIPs were obtained to evaluate the vascular anatomy.   CONTRAST:  37mL ISOVUE-370 IOPAMIDOL (ISOVUE-370) INJECTION 76%   Creatinine was obtained on site at [facility] at [address]   Results: Creatinine 0.9 mg/dL.  GFR 66   COMPARISON:  11/16/2018  and previous   FINDINGS: Cardiovascular: Heart size normal. No pericardial effusion. Satisfactory opacification of pulmonary arteries noted, and there is no evidence of pulmonary emboli. Good contrast opacification of the thoracic aorta without dissection or stenosis. Classic 3-vessel brachiocephalic arterial origin anatomy without proximal stenosis. Minimal atheromatous change.   Aortic Root:   --Valve: 2.8 cm   --Sinuses: 3.3 cm   --Sinotubular Junction: 3.0 cm   Limitations by motion: Moderate   Thoracic Aorta:   --Ascending Aorta: 3.6 cm   --Aortic Arch: 2.8 cm   --Descending Aorta: 2.6 cm   Mediastinum/Nodes: No mass or adenopathy.   Lungs/Pleura: No pleural effusion. No pneumothorax. Lungs are clear.   Upper Abdomen: No acute findings. 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   Musculoskeletal: No chest wall abnormality. No acute or significant osseous findings.   Review of the MIP images confirms the above findings.   IMPRESSION: 1. No acute findings. 2.  Aortic Atherosclerosis (ICD10-170.0).     Electronically Signed   By: Corlis Leak M.D.   On: 11/13/2020 13:54      Impression:  Her ascending aorta was measured at 3.6 cm on her current CT scan compared to 3.9 cm 2 years ago.  This is well below the surgical threshold of 5.5 cm and has been  stable for at least the last 4 years.  I reviewed the CTA images with her and answered her questions.  I stressed the importance of continued good blood pressure control in preventing further enlargement and acute aortic dissection.  Plan:  I will plan to see her back in 3 years with a CTA of the chest.  I spent 15 minutes performing this established patient evaluation and > 50% of this time was spent face to face counseling and coordinating the care of this patient's aortic aneurysm.    Alleen Borne, MD [facility] [phone]

## 2021-01-16 ENCOUNTER — Other Ambulatory Visit: Payer: Self-pay | Admitting: Internal Medicine

## 2021-01-16 DIAGNOSIS — Z1231 Encounter for screening mammogram for malignant neoplasm of breast: Secondary | ICD-10-CM

## 2021-01-21 DIAGNOSIS — M79671 Pain in right foot: Secondary | ICD-10-CM | POA: Diagnosis not present

## 2021-01-21 DIAGNOSIS — M810 Age-related osteoporosis without current pathological fracture: Secondary | ICD-10-CM | POA: Diagnosis not present

## 2021-01-21 DIAGNOSIS — M353 Polymyalgia rheumatica: Secondary | ICD-10-CM | POA: Diagnosis not present

## 2021-01-21 DIAGNOSIS — G4700 Insomnia, unspecified: Secondary | ICD-10-CM | POA: Diagnosis not present

## 2021-01-21 DIAGNOSIS — M1990 Unspecified osteoarthritis, unspecified site: Secondary | ICD-10-CM | POA: Diagnosis not present

## 2021-01-21 DIAGNOSIS — M0600 Rheumatoid arthritis without rheumatoid factor, unspecified site: Secondary | ICD-10-CM | POA: Diagnosis not present

## 2021-01-21 DIAGNOSIS — M797 Fibromyalgia: Secondary | ICD-10-CM | POA: Diagnosis not present

## 2021-01-21 DIAGNOSIS — Z79899 Other long term (current) drug therapy: Secondary | ICD-10-CM | POA: Diagnosis not present

## 2021-01-21 DIAGNOSIS — M542 Cervicalgia: Secondary | ICD-10-CM | POA: Diagnosis not present

## 2021-01-27 ENCOUNTER — Ambulatory Visit: Payer: Medicare PPO | Admitting: Cardiology

## 2021-02-13 ENCOUNTER — Encounter: Payer: Self-pay | Admitting: Oncology

## 2021-02-13 ENCOUNTER — Ambulatory Visit
Admission: RE | Admit: 2021-02-13 | Discharge: 2021-02-13 | Disposition: A | Discharge status: Home / Self Care | Payer: Medicare PPO | Source: Ambulatory Visit | Attending: Internal Medicine | Admitting: Internal Medicine

## 2021-02-13 ENCOUNTER — Other Ambulatory Visit: Payer: Self-pay

## 2021-02-13 DIAGNOSIS — M810 Age-related osteoporosis without current pathological fracture: Secondary | ICD-10-CM | POA: Diagnosis not present

## 2021-02-13 DIAGNOSIS — Z1231 Encounter for screening mammogram for malignant neoplasm of breast: Secondary | ICD-10-CM | POA: Diagnosis not present

## 2021-02-17 ENCOUNTER — Encounter: Payer: Self-pay | Admitting: Cardiology

## 2021-02-17 ENCOUNTER — Other Ambulatory Visit: Payer: Self-pay

## 2021-02-17 ENCOUNTER — Ambulatory Visit: Payer: Medicare PPO | Admitting: Cardiology

## 2021-02-17 VITALS — BP 130/64 | HR 65 | Ht 66.0 in | Wt 159.6 lb

## 2021-02-17 DIAGNOSIS — I351 Nonrheumatic aortic (valve) insufficiency: Secondary | ICD-10-CM | POA: Diagnosis not present

## 2021-02-17 DIAGNOSIS — I77810 Thoracic aortic ectasia: Secondary | ICD-10-CM | POA: Diagnosis not present

## 2021-02-17 NOTE — Patient Instructions (Addendum)
Medication Instructions:  No changes   *If you need a refill on your cardiac medications before your next appointment, please call your pharmacy*   Lab Work: Not needed   Testing/Procedures:  Not needed  Follow-Up: At [facility], you and your health needs are our priority.  As part of our continuing mission to provide you with exceptional heart care, we have created designated Provider Care Teams.  These Care Teams include your primary Cardiologist (physician) and Advanced Practice Providers (APPs -  Physician Assistants and Nurse Practitioners) who all work together to provide you with the care you need, when you need it.     Your next appointment:   24 month(s) May or June 2024  The format for your next appointment:   In Person  Provider:   Bryan Lemma, MD

## 2021-02-17 NOTE — Progress Notes (Signed)
Primary Care Provider: Merri Brunette, MD Cardiologist: None Electrophysiologist: None  Clinic Note: Chief Complaint  Patient presents with   Follow-up    Annual follow-up-mild aortic regurgitation and mild aortic dilation => CT scan results reviewed.     ===================================  ASSESSMENT/PLAN   Problem List Items Addressed This Visit       Cardiology Problems   Aortic ectasia, thoracic (HCC) (Chronic)    Current CTA readings do not show any evidence of dilation.  We will follow-up again in 2 years to recheck CTA, if stable, would probably not check any further.  Would also need an echocardiogram at the same time to correlate.  Continue to monitor blood pressure, need to be aggressive if she becomes hypertensive, but for now not on any medication. Shoot for LDL below 100 if possible.      Moderate aortic regurgitation - Primary (Chronic)    Most recent echo did not really show much in the way of any significant aortic insufficiency.  AI murmur not heard on exam.  Continue to monitor blood pressure and treat accordingly and low threshold to initiate treatment for lipids.  Can recheck in 2 years.      Relevant Orders   EKG 12-Lead (Completed)    ===================================  HPI:    Andrea Mata is a 75 y.o. female with a PMH notable for Fibromyalgia/PMR and Mild-Moderate aortic regurgitation with DILATED THORACIC AORTA below who presents today for Annual follow-up.  Andrea Mata was last seen on December 04, 2019 she was doing very well from a cardiac standpoint.  Very immobilized because of her PMR and fibromyalgia.  Very deconditioned.  She will get short of breath with exertion because deconditioning, no chest pain or pressure.  Blood pressure well controlled.  No PND or orthopnea with well-controlled mild end of day edema..  .  Plan as of November 16, 2018 was to recheck CTA aorta this in 2022.  Recent Hospitalizations:  None  Reviewed  CV studies:    The following studies were reviewed today: (if available, images/films reviewed: From Epic Chart or Care Everywhere) CT Chest Aortogram 11/13/2020: : No acute findings.  Aortic atherosclerosis noted but no aneurysmal dilation..  Ascending aorta 3.6 cm, 3.3 cm at the sinuses.  Classic three-vessel brachiocephalic artery origin without proximal stenosis minimal atheromatous changes.   Interval History:   Andrea Mata returns for annual follow-up overall doing pretty well.  She really does not have any major complaints.  Still very deconditioned from her PMR and fibromyalgia related joint and muscle pains and weakness.  Very deconditioned and therefore does have exertional dyspnea but not worsening.  No chest pain or pressure.  Mild baseline edema but no PND orthopnea.  No palpitations or irregular heartbeats.  CV Review of Symptoms (Summary) Cardiovascular ROS: positive for - dyspnea on exertion, edema, and -> exertional dyspnea is related to deconditioning.  Edema related to muscle atrophy. negative for - chest pain, irregular heartbeat, orthopnea, palpitations, paroxysmal nocturnal dyspnea, rapid heart rate, shortness of breath, or lightheadedness or dizziness consciousness.  Syncope/near syncope or TIA/amaurosis fugax, claudication  REVIEWED OF SYSTEMS   Pertinent symptoms discussed above.  Otherwise negative have not noted Malaise fatigue-just feels generally tired.   Multiple joint pains and myalgias with generalized muscle weakness from PMR. Baseline anxiety with occasional exacerbations but not full-blown panic attacks.   I have reviewed and (if needed) personally updated the patient's problem list, medications, allergies, past medical and surgical history, social  and family history.   PAST MEDICAL HISTORY   Past Medical History:  Diagnosis Date   Anemia    Constipation    Fibromyalgia 2006   GERD (gastroesophageal reflux disease)    History  of shingles    mid thoracic extending to front of right side of chest   Hyperlipidemia    Osteopenia    Polymyalgia rheumatica (HCC) 2005    PAST SURGICAL HISTORY   Past Surgical History:  Procedure Laterality Date   ABDOMINAL HYSTERECTOMY  1982   Partial hysterectomy with removal of her left ovary   adenomatous polyps     TRANSTHORACIC ECHOCARDIOGRAM  09/2018   Echo: EF 60 to 65%.  Normal LV size.  No R WMA.  GR 2 DD moderate aortic valve thickening with mild aortic regurgitation normal aortic root size mild ascending aortic dilation.     There is no immunization history on file for this patient.  MEDICATIONS/ALLERGIES   Current Meds  Medication Sig   Cholecalciferol 25 MCG (1000 UT) capsule Take 1,000 Units by mouth 2 (two) times daily.   cyanocobalamin (,VITAMIN B-12,) 1000 MCG/ML injection INJECT IM ONCE MONTHLY   DULoxetine (CYMBALTA) 60 MG capsule Take 60 mg by mouth daily.    folic acid (FOLVITE) 1 MG tablet Take 2 mg by mouth daily.   LYRICA 100 MG capsule Take 100 mg by mouth 2 (two) times daily.   methotrexate (RHEUMATREX) 2.5 MG tablet Take 5 tablets by mouth once a week.   predniSONE (DELTASONE) 5 MG tablet Take 5 mg by mouth daily.     traZODone (DESYREL) 50 MG tablet Take 50 mg by mouth at bedtime.      No Known Allergies  SOCIAL HISTORY/FAMILY HISTORY   Reviewed in Epic:  Pertinent findings:  Social History   Tobacco Use   Smoking status: Never   Smokeless tobacco: Never  Substance Use Topics   Alcohol use: Yes    Comment: occasionally   Drug use: No   Social History   Social History Narrative   She is a married mother of 2, grandmother of 2.  She lives with her husband of 53 years.  She is a retired [employer].   She never smoked.  She may be has 2 alcoholic beverages a week.   Does not exercise, simply because she does not feel like it.    OBJCTIVE -PE, EKG, labs   Wt Readings from Last 3 Encounters:   02/17/21 159 lb 9.6 oz (72.4 kg)  11/13/20 159 lb (72.1 kg)  12/04/19 162 lb 12.8 oz (73.8 kg)    Physical Exam: BP 130/64   Pulse 65   Ht 5\' 6"  (1.676 m)   Wt 159 lb 9.6 oz (72.4 kg)   SpO2 96%   BMI 25.76 kg/m  Physical Exam Vitals reviewed.  Constitutional:      General: She is not in acute distress.    Appearance: She is normal weight. She is not toxic-appearing.     Comments: Chronically ill/tired-appearing but otherwise well-nourished well-groomed.    HENT:     Head: Normocephalic and atraumatic.  Neck:     Vascular: No carotid bruit, hepatojugular reflux or JVD.  Cardiovascular:     Rate and Rhythm: Normal rate and regular rhythm. No extrasystoles are present.    Chest Wall: PMI is not displaced.     Pulses: Normal pulses.     Heart sounds: S1 normal and S2 normal. Heart sounds  are distant. Murmur (1/6 SEM at RUSB.) heard.    No friction rub. No gallop.  Pulmonary:     Effort: Pulmonary effort is normal. No respiratory distress.     Breath sounds: Normal breath sounds. No wheezing, rhonchi or rales.  Chest:     Chest wall: Tenderness (Sternal tenderness to palpation.) present.  Abdominal:     General: Bowel sounds are normal.  Musculoskeletal:        General: Normal range of motion.     Cervical back: Normal range of motion and neck supple.  Skin:    General: Skin is warm.  Neurological:     General: No focal deficit present.     Mental Status: She is alert and oriented to person, place, and time.     Gait: Gait abnormal.  Psychiatric:        Mood and Affect: Mood normal.        Behavior: Behavior normal.        Thought Content: Thought content normal.        Judgment: Judgment normal.     Comments: Somewhat timid and anxious.    Adult ECG Report  Rate: 65;  Rhythm: normal sinus rhythm and RBBB.  Otherwise normal axis, intervals durations. ;   Narrative Interpretation: Stable  Recent Labs:   05/29/2020: TC 185, TG 114, HDL 58, LDL 57.  Cr 0.7, K+  4.3. Lab Results  Component Value Date   CHOL 192 07/07/2017   HDL 53 07/07/2017   LDLCALC 107 (H) 07/07/2017   TRIG 161 (H) 07/07/2017   CHOLHDL 3.6 07/07/2017   Lab Results  Component Value Date   CREATININE 0.93 07/07/2017   BUN 17 07/07/2017   NA 143 07/07/2017   K 4.3 07/07/2017   CL 101 07/07/2017   CO2 28 07/07/2017   CBC Latest Ref Rng & Units 05/18/2011 12/08/2010 09/08/2010  WBC 3.9 - 10.3 10e3/uL 6.4 8.5 9.1  Hemoglobin 11.6 - 15.9 g/dL 16.1 09.6 04.5  Hematocrit 34.8 - 46.6 % 44.8 39.0 41.9  Platelets 145 - 400 10e3/uL 236 235 253    No results found for: HGBA1C No results found for: TSH  ==================================================  COVID-19 Education: The signs and symptoms of COVID-19 were discussed with the patient and how to seek care for testing (follow up with PCP or arrange E-visit).    I spent a total of 20 minutes with the patient spent in direct patient consultation.  Additional time spent with chart review  / charting (studies, outside notes, etc): 12 min Total Time: 32 min  Current medicines are reviewed at length with the patient today.  (+/- concerns) none  This visit occurred during the SARS-CoV-2 public health emergency.  Safety protocols were in place, including screening questions prior to the visit, additional usage of staff PPE, and extensive cleaning of exam room while observing appropriate contact time as indicated for disinfecting solutions.  Notice: This dictation was prepared with Dragon dictation along with smart phrase technology. Any transcriptional errors that result from this process are unintentional and may not be corrected upon review.  Patient Instructions / Medication Changes & Studies & Tests Ordered   Patient Instructions  Medication Instructions:  No changes   *If you need a refill on your cardiac medications before your next appointment, please call your pharmacy*   Lab Work: Not  needed   Testing/Procedures:  Not needed  Follow-Up: At [facility], you and your health needs are our priority.  As part  of our continuing mission to provide you with exceptional heart care, we have created designated Provider Care Teams.  These Care Teams include your primary Cardiologist (physician) and Advanced Practice Providers (APPs -  Physician Assistants and Nurse Practitioners) who all work together to provide you with the care you need, when you need it.     Your next appointment:   24 month(s) May or June 2024  The format for your next appointment:   In Person  Provider:   Bryan Lemma, MD     Studies Ordered:   Orders Placed This Encounter  Procedures   EKG 12-Lead     Bryan Lemma, M.D., M.S. Interventional Cardiologist   Pager # [phone] Phone # [phone] [address] [place], [place] [address]   Thank you for choosing Heartcare at [facility]!!

## 2021-03-16 ENCOUNTER — Encounter: Payer: Self-pay | Admitting: Cardiology

## 2021-03-16 NOTE — Assessment & Plan Note (Addendum)
Current CTA readings do not show any evidence of dilation.  We will follow-up again in 2 years to recheck CTA, if stable, would probably not check any further.  Would also need an echocardiogram at the same time to correlate.  Continue to monitor blood pressure, need to be aggressive if she becomes hypertensive, but for now not on any medication. Shoot for LDL below 100 if possible.

## 2021-03-16 NOTE — Assessment & Plan Note (Signed)
Most recent echo did not really show much in the way of any significant aortic insufficiency.  AI murmur not heard on exam.  Continue to monitor blood pressure and treat accordingly and low threshold to initiate treatment for lipids.  Can recheck in 2 years.

## 2021-05-27 DIAGNOSIS — M542 Cervicalgia: Secondary | ICD-10-CM | POA: Diagnosis not present

## 2021-05-27 DIAGNOSIS — Z79899 Other long term (current) drug therapy: Secondary | ICD-10-CM | POA: Diagnosis not present

## 2021-05-27 DIAGNOSIS — M0600 Rheumatoid arthritis without rheumatoid factor, unspecified site: Secondary | ICD-10-CM | POA: Diagnosis not present

## 2021-05-27 DIAGNOSIS — M797 Fibromyalgia: Secondary | ICD-10-CM | POA: Diagnosis not present

## 2021-05-27 DIAGNOSIS — M1990 Unspecified osteoarthritis, unspecified site: Secondary | ICD-10-CM | POA: Diagnosis not present

## 2021-05-27 DIAGNOSIS — M810 Age-related osteoporosis without current pathological fracture: Secondary | ICD-10-CM | POA: Diagnosis not present

## 2021-05-27 DIAGNOSIS — G4700 Insomnia, unspecified: Secondary | ICD-10-CM | POA: Diagnosis not present

## 2021-05-27 DIAGNOSIS — M79671 Pain in right foot: Secondary | ICD-10-CM | POA: Diagnosis not present

## 2021-05-27 DIAGNOSIS — M353 Polymyalgia rheumatica: Secondary | ICD-10-CM | POA: Diagnosis not present

## 2021-06-06 DIAGNOSIS — E785 Hyperlipidemia, unspecified: Secondary | ICD-10-CM | POA: Diagnosis not present

## 2021-06-06 DIAGNOSIS — Z0000 Encounter for general adult medical examination without abnormal findings: Secondary | ICD-10-CM | POA: Diagnosis not present

## 2021-06-06 DIAGNOSIS — R946 Abnormal results of thyroid function studies: Secondary | ICD-10-CM | POA: Diagnosis not present

## 2021-06-06 DIAGNOSIS — E559 Vitamin D deficiency, unspecified: Secondary | ICD-10-CM | POA: Diagnosis not present

## 2021-06-11 DIAGNOSIS — M810 Age-related osteoporosis without current pathological fracture: Secondary | ICD-10-CM | POA: Diagnosis not present

## 2021-06-11 DIAGNOSIS — E039 Hypothyroidism, unspecified: Secondary | ICD-10-CM | POA: Diagnosis not present

## 2021-06-11 DIAGNOSIS — M797 Fibromyalgia: Secondary | ICD-10-CM | POA: Diagnosis not present

## 2021-06-11 DIAGNOSIS — I712 Thoracic aortic aneurysm, without rupture, unspecified: Secondary | ICD-10-CM | POA: Diagnosis not present

## 2021-06-11 DIAGNOSIS — E538 Deficiency of other specified B group vitamins: Secondary | ICD-10-CM | POA: Diagnosis not present

## 2021-06-11 DIAGNOSIS — Z0000 Encounter for general adult medical examination without abnormal findings: Secondary | ICD-10-CM | POA: Diagnosis not present

## 2021-06-11 DIAGNOSIS — N182 Chronic kidney disease, stage 2 (mild): Secondary | ICD-10-CM | POA: Diagnosis not present

## 2021-06-11 DIAGNOSIS — M353 Polymyalgia rheumatica: Secondary | ICD-10-CM | POA: Diagnosis not present

## 2021-06-13 ENCOUNTER — Other Ambulatory Visit: Payer: Self-pay | Admitting: Cardiology

## 2021-06-13 ENCOUNTER — Telehealth: Payer: Self-pay | Admitting: Cardiology

## 2021-06-13 NOTE — Progress Notes (Signed)
° ° °  LAB SUMMARY Labs sent in by PCP (Dr. Ola Spurr) dated 06/06/2021  06/06/2021 Na+ 144, K+ 4.4, Cl- 106, HCO3-30, BUN 17, Cr 0.94, Glu 89, Ca2+ 8.6; AST 22, ALT 33, AlkP 82, ALB 3.4 (stable) CBC: W 4.5, H/H 13.4/42.2, Plt 180 (stable); TSH 5.41 (up from 2.44 in 05/2020) TC 171, TG 102, HDL 60, LDL 91 (improved) From 05/2020: TC 185, TG 114, HDL 58, LDL 107    Bryan Lemma, MD

## 2021-06-13 NOTE — Telephone Encounter (Signed)
° ° °  LAB SUMMARY From PCP-Dr. Merri Brunette   06/06/2021 Na+ 144, K+ 4.4, Cl- 106, HCO3- 30 , BUN 17, Cr 0.94, Glu 89, Ca2+ 8.6; AST 22, ALT 33, AlkP 82 (stable) CBC: W 4, H/H 13.4/42.2, Plt 180 (stable) TC 171, TG 102, HDL 60, LDL 91 (improved from 05/2020: TC 185, TG 114, HDL 58, LDL107  Bryan Lemma, MD

## 2021-06-24 DIAGNOSIS — M1990 Unspecified osteoarthritis, unspecified site: Secondary | ICD-10-CM | POA: Diagnosis not present

## 2021-06-24 DIAGNOSIS — M549 Dorsalgia, unspecified: Secondary | ICD-10-CM | POA: Diagnosis not present

## 2021-06-24 DIAGNOSIS — M542 Cervicalgia: Secondary | ICD-10-CM | POA: Diagnosis not present

## 2021-06-24 DIAGNOSIS — M353 Polymyalgia rheumatica: Secondary | ICD-10-CM | POA: Diagnosis not present

## 2021-06-24 DIAGNOSIS — Z79899 Other long term (current) drug therapy: Secondary | ICD-10-CM | POA: Diagnosis not present

## 2021-06-24 DIAGNOSIS — G4700 Insomnia, unspecified: Secondary | ICD-10-CM | POA: Diagnosis not present

## 2021-06-24 DIAGNOSIS — M810 Age-related osteoporosis without current pathological fracture: Secondary | ICD-10-CM | POA: Diagnosis not present

## 2021-06-24 DIAGNOSIS — M0600 Rheumatoid arthritis without rheumatoid factor, unspecified site: Secondary | ICD-10-CM | POA: Diagnosis not present

## 2021-06-24 DIAGNOSIS — M797 Fibromyalgia: Secondary | ICD-10-CM | POA: Diagnosis not present

## 2021-08-06 DIAGNOSIS — H11041 Peripheral pterygium, stationary, right eye: Secondary | ICD-10-CM | POA: Diagnosis not present

## 2021-08-06 DIAGNOSIS — H35373 Puckering of macula, bilateral: Secondary | ICD-10-CM | POA: Diagnosis not present

## 2021-08-06 DIAGNOSIS — H04123 Dry eye syndrome of bilateral lacrimal glands: Secondary | ICD-10-CM | POA: Diagnosis not present

## 2021-08-06 DIAGNOSIS — Z961 Presence of intraocular lens: Secondary | ICD-10-CM | POA: Diagnosis not present

## 2021-08-11 DIAGNOSIS — D3617 Benign neoplasm of peripheral nerves and autonomic nervous system of trunk, unspecified: Secondary | ICD-10-CM | POA: Diagnosis not present

## 2021-08-11 DIAGNOSIS — E538 Deficiency of other specified B group vitamins: Secondary | ICD-10-CM | POA: Diagnosis not present

## 2021-08-11 DIAGNOSIS — E039 Hypothyroidism, unspecified: Secondary | ICD-10-CM | POA: Diagnosis not present

## 2021-08-11 DIAGNOSIS — D225 Melanocytic nevi of trunk: Secondary | ICD-10-CM | POA: Diagnosis not present

## 2021-08-11 DIAGNOSIS — D485 Neoplasm of uncertain behavior of skin: Secondary | ICD-10-CM | POA: Diagnosis not present

## 2021-08-18 DIAGNOSIS — M810 Age-related osteoporosis without current pathological fracture: Secondary | ICD-10-CM | POA: Diagnosis not present

## 2021-09-23 DIAGNOSIS — M1990 Unspecified osteoarthritis, unspecified site: Secondary | ICD-10-CM | POA: Diagnosis not present

## 2021-09-23 DIAGNOSIS — M353 Polymyalgia rheumatica: Secondary | ICD-10-CM | POA: Diagnosis not present

## 2021-09-23 DIAGNOSIS — M549 Dorsalgia, unspecified: Secondary | ICD-10-CM | POA: Diagnosis not present

## 2021-09-23 DIAGNOSIS — M542 Cervicalgia: Secondary | ICD-10-CM | POA: Diagnosis not present

## 2021-09-23 DIAGNOSIS — G4700 Insomnia, unspecified: Secondary | ICD-10-CM | POA: Diagnosis not present

## 2021-09-23 DIAGNOSIS — Z79899 Other long term (current) drug therapy: Secondary | ICD-10-CM | POA: Diagnosis not present

## 2021-09-23 DIAGNOSIS — M0600 Rheumatoid arthritis without rheumatoid factor, unspecified site: Secondary | ICD-10-CM | POA: Diagnosis not present

## 2021-09-23 DIAGNOSIS — M810 Age-related osteoporosis without current pathological fracture: Secondary | ICD-10-CM | POA: Diagnosis not present

## 2021-09-23 DIAGNOSIS — M797 Fibromyalgia: Secondary | ICD-10-CM | POA: Diagnosis not present

## 2022-01-29 DIAGNOSIS — F419 Anxiety disorder, unspecified: Secondary | ICD-10-CM | POA: Diagnosis not present

## 2022-01-29 DIAGNOSIS — Z23 Encounter for immunization: Secondary | ICD-10-CM | POA: Diagnosis not present

## 2022-01-29 DIAGNOSIS — N951 Menopausal and female climacteric states: Secondary | ICD-10-CM | POA: Diagnosis not present

## 2022-02-06 ENCOUNTER — Other Ambulatory Visit: Payer: Self-pay | Admitting: Internal Medicine

## 2022-02-06 DIAGNOSIS — Z1231 Encounter for screening mammogram for malignant neoplasm of breast: Secondary | ICD-10-CM

## 2022-02-18 DIAGNOSIS — M810 Age-related osteoporosis without current pathological fracture: Secondary | ICD-10-CM | POA: Diagnosis not present

## 2022-02-18 DIAGNOSIS — F329 Major depressive disorder, single episode, unspecified: Secondary | ICD-10-CM | POA: Diagnosis not present

## 2022-02-24 DIAGNOSIS — M25561 Pain in right knee: Secondary | ICD-10-CM | POA: Diagnosis not present

## 2022-02-24 DIAGNOSIS — Z79899 Other long term (current) drug therapy: Secondary | ICD-10-CM | POA: Diagnosis not present

## 2022-02-24 DIAGNOSIS — M797 Fibromyalgia: Secondary | ICD-10-CM | POA: Diagnosis not present

## 2022-02-24 DIAGNOSIS — M353 Polymyalgia rheumatica: Secondary | ICD-10-CM | POA: Diagnosis not present

## 2022-02-24 DIAGNOSIS — M810 Age-related osteoporosis without current pathological fracture: Secondary | ICD-10-CM | POA: Diagnosis not present

## 2022-02-24 DIAGNOSIS — M1990 Unspecified osteoarthritis, unspecified site: Secondary | ICD-10-CM | POA: Diagnosis not present

## 2022-02-24 DIAGNOSIS — M542 Cervicalgia: Secondary | ICD-10-CM | POA: Diagnosis not present

## 2022-02-24 DIAGNOSIS — M0600 Rheumatoid arthritis without rheumatoid factor, unspecified site: Secondary | ICD-10-CM | POA: Diagnosis not present

## 2022-02-24 DIAGNOSIS — G4700 Insomnia, unspecified: Secondary | ICD-10-CM | POA: Diagnosis not present

## 2022-03-23 ENCOUNTER — Ambulatory Visit
Admission: RE | Admit: 2022-03-23 | Discharge: 2022-03-23 | Disposition: A | Discharge status: Home / Self Care | Payer: Medicare PPO | Source: Ambulatory Visit | Attending: Internal Medicine | Admitting: Internal Medicine

## 2022-03-23 DIAGNOSIS — Z1231 Encounter for screening mammogram for malignant neoplasm of breast: Secondary | ICD-10-CM

## 2022-03-28 DIAGNOSIS — M069 Rheumatoid arthritis, unspecified: Secondary | ICD-10-CM | POA: Diagnosis not present

## 2022-04-09 DIAGNOSIS — M1990 Unspecified osteoarthritis, unspecified site: Secondary | ICD-10-CM | POA: Diagnosis not present

## 2022-04-09 DIAGNOSIS — M25561 Pain in right knee: Secondary | ICD-10-CM | POA: Diagnosis not present

## 2022-04-09 DIAGNOSIS — M542 Cervicalgia: Secondary | ICD-10-CM | POA: Diagnosis not present

## 2022-04-09 DIAGNOSIS — M797 Fibromyalgia: Secondary | ICD-10-CM | POA: Diagnosis not present

## 2022-04-09 DIAGNOSIS — M353 Polymyalgia rheumatica: Secondary | ICD-10-CM | POA: Diagnosis not present

## 2022-04-09 DIAGNOSIS — G4700 Insomnia, unspecified: Secondary | ICD-10-CM | POA: Diagnosis not present

## 2022-04-09 DIAGNOSIS — Z79899 Other long term (current) drug therapy: Secondary | ICD-10-CM | POA: Diagnosis not present

## 2022-04-09 DIAGNOSIS — M810 Age-related osteoporosis without current pathological fracture: Secondary | ICD-10-CM | POA: Diagnosis not present

## 2022-04-09 DIAGNOSIS — M0600 Rheumatoid arthritis without rheumatoid factor, unspecified site: Secondary | ICD-10-CM | POA: Diagnosis not present

## 2022-04-23 DIAGNOSIS — M62830 Muscle spasm of back: Secondary | ICD-10-CM | POA: Diagnosis not present

## 2022-04-24 DIAGNOSIS — M1990 Unspecified osteoarthritis, unspecified site: Secondary | ICD-10-CM | POA: Diagnosis not present

## 2022-04-24 DIAGNOSIS — M545 Low back pain, unspecified: Secondary | ICD-10-CM | POA: Diagnosis not present

## 2022-05-01 DIAGNOSIS — M545 Low back pain, unspecified: Secondary | ICD-10-CM | POA: Diagnosis not present

## 2022-05-08 IMAGING — MG DIGITAL SCREENING BILAT W/ TOMO W/ CAD
8 series · 8 of 24 positions shown · non-contrast
Comparison: Previous exam(s).

CLINICAL DATA: Screening.

EXAM:
DIGITAL SCREENING BILATERAL MAMMOGRAM WITH TOMO AND CAD

[L CC synth-2D]
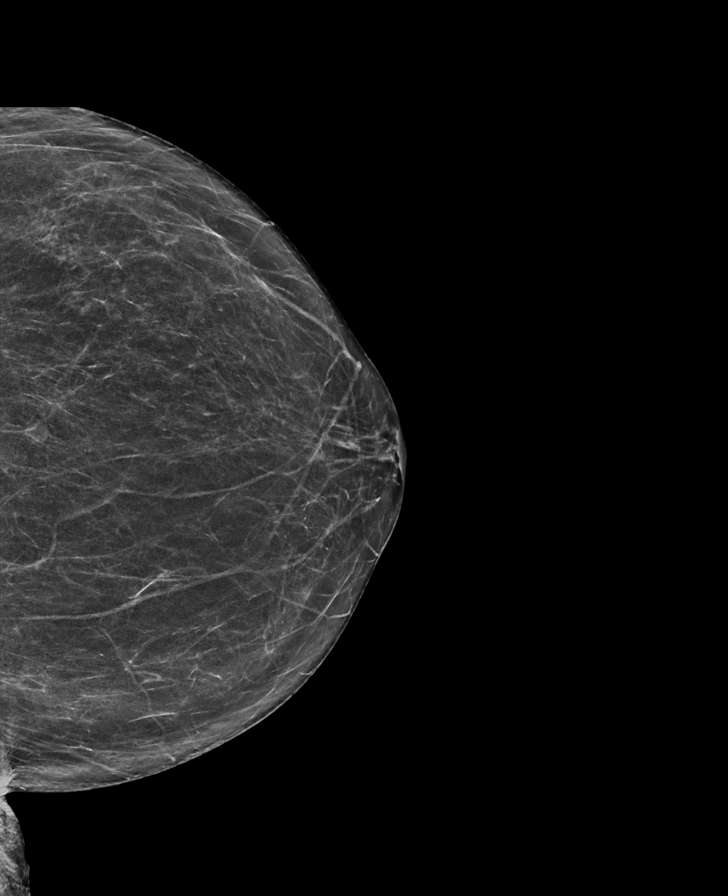

[R MLO synth-2D]
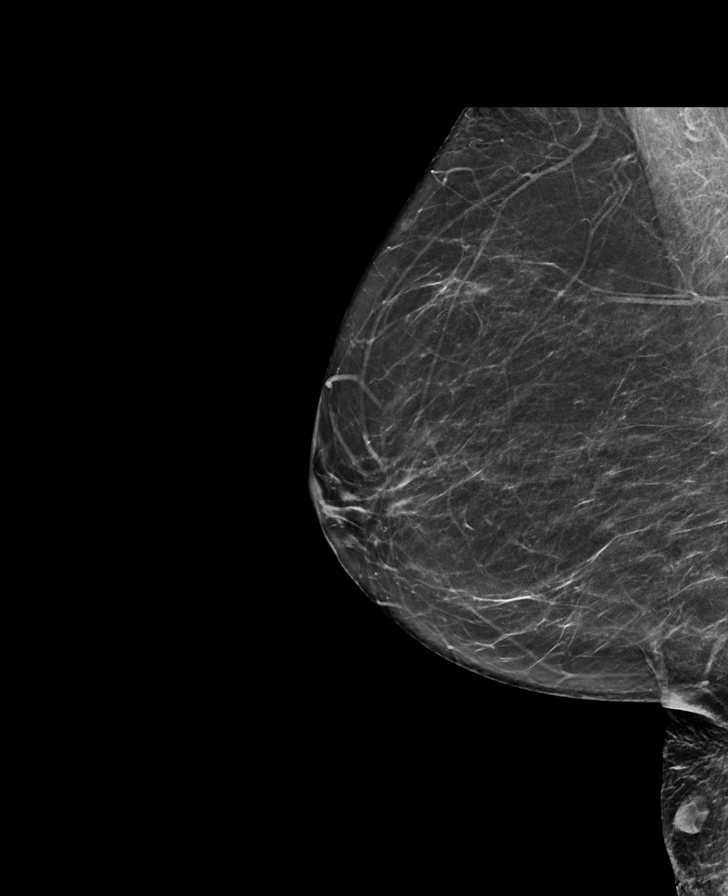

[R CC synth-2D]
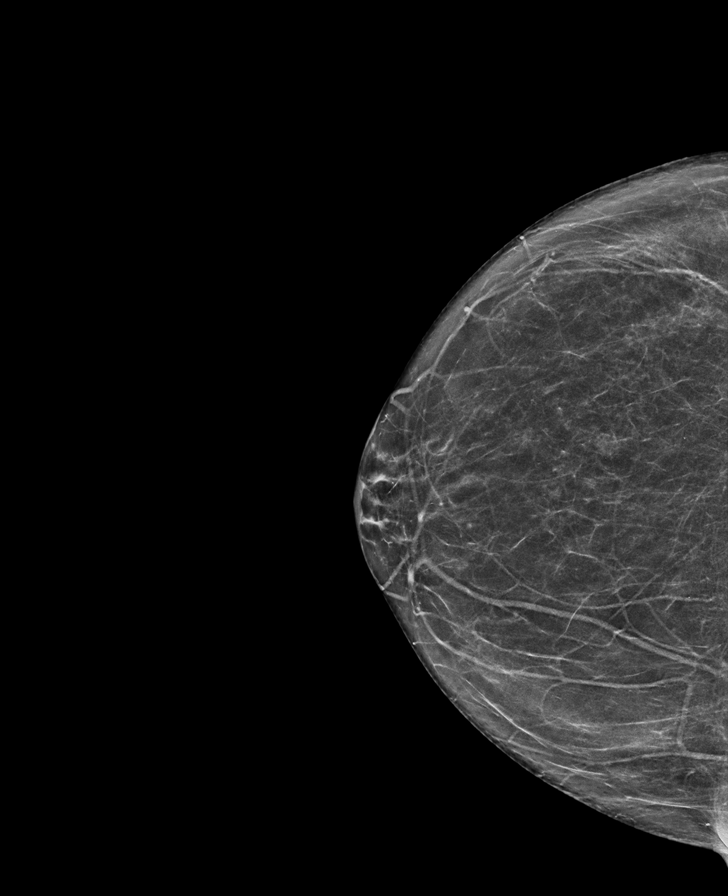

[L MLO synth-2D]
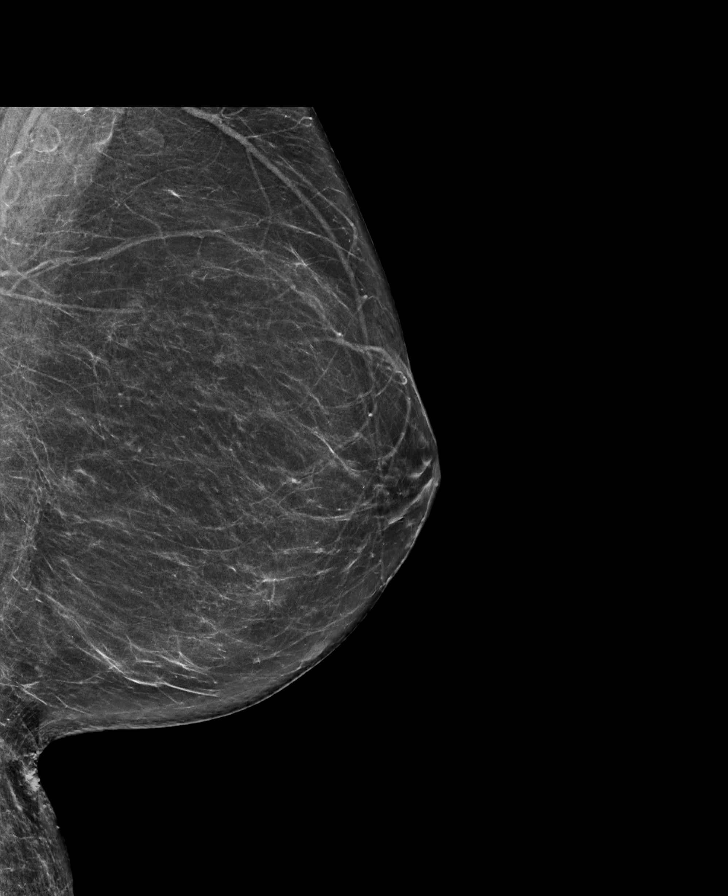

[L CC tomo · tomo slice 31/62.0]
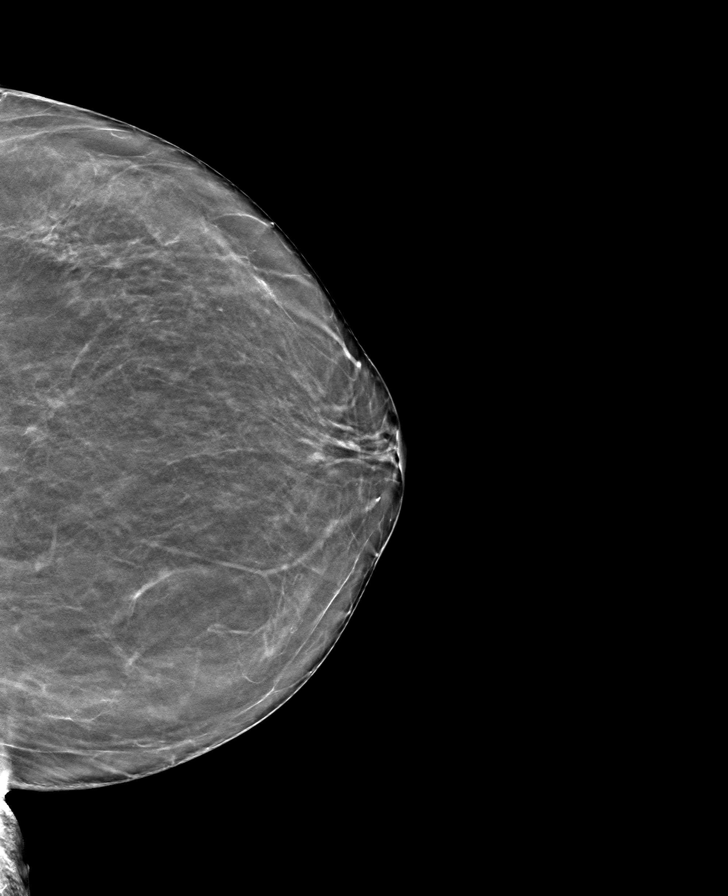

[L MLO tomo · tomo slice 35/69.0]
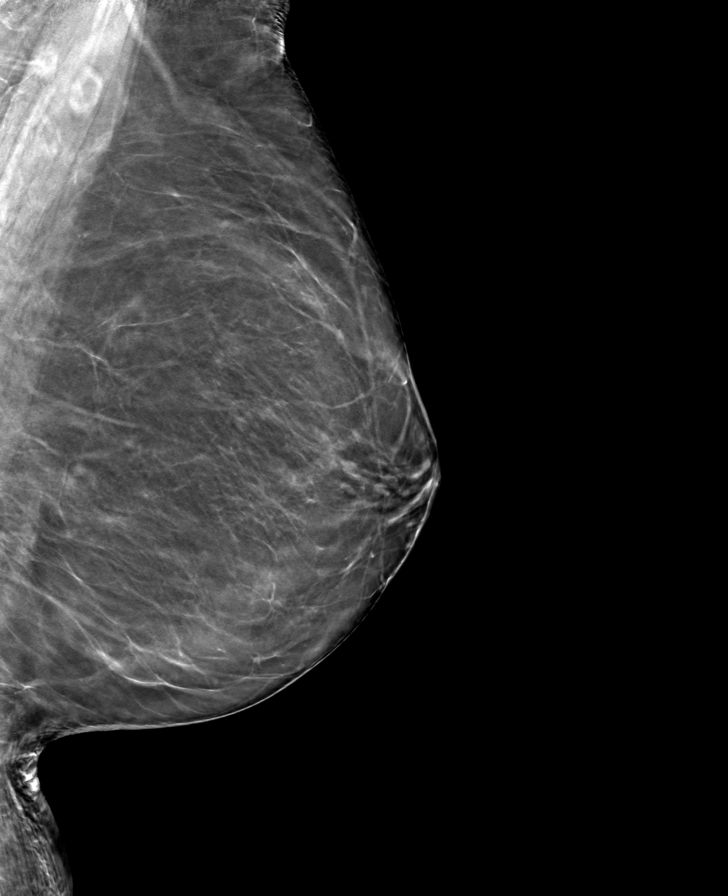

[R MLO tomo · tomo slice 33/65.0]
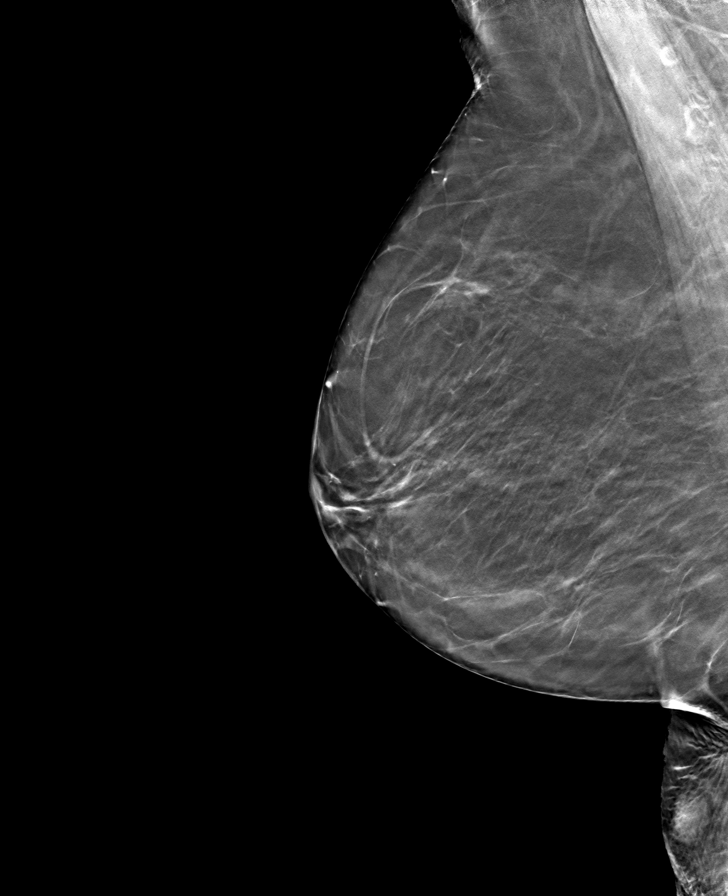

[R CC tomo · tomo slice 32/63.0]
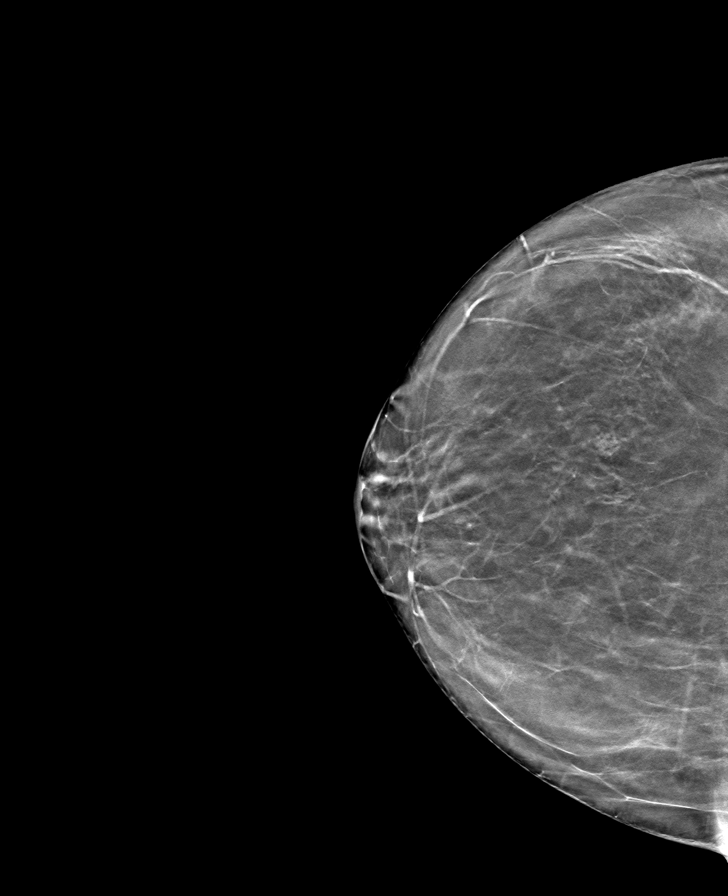

[8 of 24 positions shown; findings below may reference images not displayed]

ACR Breast Density Category b: There are scattered areas of
fibroglandular density.
FINDINGS: There are no findings suspicious for malignancy. Images were
processed with CAD.
IMPRESSION: No mammographic evidence of malignancy. A result letter of this
screening mammogram will be mailed directly to the patient.

RECOMMENDATION:
Screening mammogram in one year. (Code:[id number])

BI-RADS CATEGORY  1: Negative.

## 2022-05-14 DIAGNOSIS — M7918 Myalgia, other site: Secondary | ICD-10-CM | POA: Diagnosis not present

## 2022-05-14 DIAGNOSIS — M545 Low back pain, unspecified: Secondary | ICD-10-CM | POA: Diagnosis not present

## 2022-05-18 DIAGNOSIS — M7918 Myalgia, other site: Secondary | ICD-10-CM | POA: Diagnosis not present

## 2022-05-18 DIAGNOSIS — M545 Low back pain, unspecified: Secondary | ICD-10-CM | POA: Diagnosis not present

## 2022-05-22 DIAGNOSIS — M7918 Myalgia, other site: Secondary | ICD-10-CM | POA: Diagnosis not present

## 2022-05-22 DIAGNOSIS — M545 Low back pain, unspecified: Secondary | ICD-10-CM | POA: Diagnosis not present

## 2022-05-28 DIAGNOSIS — M7918 Myalgia, other site: Secondary | ICD-10-CM | POA: Diagnosis not present

## 2022-05-28 DIAGNOSIS — M545 Low back pain, unspecified: Secondary | ICD-10-CM | POA: Diagnosis not present

## 2022-06-02 DIAGNOSIS — M545 Low back pain, unspecified: Secondary | ICD-10-CM | POA: Diagnosis not present

## 2022-06-02 DIAGNOSIS — M7918 Myalgia, other site: Secondary | ICD-10-CM | POA: Diagnosis not present

## 2022-06-05 DIAGNOSIS — M791 Myalgia, unspecified site: Secondary | ICD-10-CM | POA: Diagnosis not present

## 2022-06-09 DIAGNOSIS — Z0000 Encounter for general adult medical examination without abnormal findings: Secondary | ICD-10-CM | POA: Diagnosis not present

## 2022-06-09 DIAGNOSIS — E785 Hyperlipidemia, unspecified: Secondary | ICD-10-CM | POA: Diagnosis not present

## 2022-06-09 DIAGNOSIS — E538 Deficiency of other specified B group vitamins: Secondary | ICD-10-CM | POA: Diagnosis not present

## 2022-06-09 DIAGNOSIS — E039 Hypothyroidism, unspecified: Secondary | ICD-10-CM | POA: Diagnosis not present

## 2022-06-09 DIAGNOSIS — Z79899 Other long term (current) drug therapy: Secondary | ICD-10-CM | POA: Diagnosis not present

## 2022-06-11 DIAGNOSIS — M545 Low back pain, unspecified: Secondary | ICD-10-CM | POA: Diagnosis not present

## 2022-06-11 DIAGNOSIS — M7918 Myalgia, other site: Secondary | ICD-10-CM | POA: Diagnosis not present

## 2022-06-16 DIAGNOSIS — E538 Deficiency of other specified B group vitamins: Secondary | ICD-10-CM | POA: Diagnosis not present

## 2022-06-16 DIAGNOSIS — M064 Inflammatory polyarthropathy: Secondary | ICD-10-CM | POA: Diagnosis not present

## 2022-06-16 DIAGNOSIS — Z7952 Long term (current) use of systemic steroids: Secondary | ICD-10-CM | POA: Diagnosis not present

## 2022-06-16 DIAGNOSIS — N182 Chronic kidney disease, stage 2 (mild): Secondary | ICD-10-CM | POA: Diagnosis not present

## 2022-06-16 DIAGNOSIS — I712 Thoracic aortic aneurysm, without rupture, unspecified: Secondary | ICD-10-CM | POA: Diagnosis not present

## 2022-06-16 DIAGNOSIS — Z0000 Encounter for general adult medical examination without abnormal findings: Secondary | ICD-10-CM | POA: Diagnosis not present

## 2022-06-16 DIAGNOSIS — E559 Vitamin D deficiency, unspecified: Secondary | ICD-10-CM | POA: Diagnosis not present

## 2022-06-16 DIAGNOSIS — E039 Hypothyroidism, unspecified: Secondary | ICD-10-CM | POA: Diagnosis not present

## 2022-06-16 DIAGNOSIS — M810 Age-related osteoporosis without current pathological fracture: Secondary | ICD-10-CM | POA: Diagnosis not present

## 2022-07-09 DIAGNOSIS — M7918 Myalgia, other site: Secondary | ICD-10-CM | POA: Diagnosis not present

## 2022-07-28 DIAGNOSIS — M353 Polymyalgia rheumatica: Secondary | ICD-10-CM | POA: Diagnosis not present

## 2022-07-28 DIAGNOSIS — F332 Major depressive disorder, recurrent severe without psychotic features: Secondary | ICD-10-CM | POA: Diagnosis not present

## 2022-08-12 DIAGNOSIS — H11041 Peripheral pterygium, stationary, right eye: Secondary | ICD-10-CM | POA: Diagnosis not present

## 2022-08-12 DIAGNOSIS — H04123 Dry eye syndrome of bilateral lacrimal glands: Secondary | ICD-10-CM | POA: Diagnosis not present

## 2022-08-12 DIAGNOSIS — H35371 Puckering of macula, right eye: Secondary | ICD-10-CM | POA: Diagnosis not present

## 2022-08-12 DIAGNOSIS — H0102A Squamous blepharitis right eye, upper and lower eyelids: Secondary | ICD-10-CM | POA: Diagnosis not present

## 2022-08-18 DIAGNOSIS — G4700 Insomnia, unspecified: Secondary | ICD-10-CM | POA: Diagnosis not present

## 2022-08-18 DIAGNOSIS — E559 Vitamin D deficiency, unspecified: Secondary | ICD-10-CM | POA: Diagnosis not present

## 2022-08-18 DIAGNOSIS — F332 Major depressive disorder, recurrent severe without psychotic features: Secondary | ICD-10-CM | POA: Diagnosis not present

## 2022-08-25 DIAGNOSIS — M9902 Segmental and somatic dysfunction of thoracic region: Secondary | ICD-10-CM | POA: Diagnosis not present

## 2022-08-25 DIAGNOSIS — M5134 Other intervertebral disc degeneration, thoracic region: Secondary | ICD-10-CM | POA: Diagnosis not present

## 2022-08-27 DIAGNOSIS — M5134 Other intervertebral disc degeneration, thoracic region: Secondary | ICD-10-CM | POA: Diagnosis not present

## 2022-08-27 DIAGNOSIS — M9902 Segmental and somatic dysfunction of thoracic region: Secondary | ICD-10-CM | POA: Diagnosis not present

## 2022-08-27 DIAGNOSIS — M810 Age-related osteoporosis without current pathological fracture: Secondary | ICD-10-CM | POA: Diagnosis not present

## 2022-09-01 DIAGNOSIS — M9902 Segmental and somatic dysfunction of thoracic region: Secondary | ICD-10-CM | POA: Diagnosis not present

## 2022-09-01 DIAGNOSIS — M5134 Other intervertebral disc degeneration, thoracic region: Secondary | ICD-10-CM | POA: Diagnosis not present

## 2022-09-08 DIAGNOSIS — M9902 Segmental and somatic dysfunction of thoracic region: Secondary | ICD-10-CM | POA: Diagnosis not present

## 2022-09-08 DIAGNOSIS — M5134 Other intervertebral disc degeneration, thoracic region: Secondary | ICD-10-CM | POA: Diagnosis not present

## 2022-09-17 DIAGNOSIS — M5134 Other intervertebral disc degeneration, thoracic region: Secondary | ICD-10-CM | POA: Diagnosis not present

## 2022-09-17 DIAGNOSIS — M9902 Segmental and somatic dysfunction of thoracic region: Secondary | ICD-10-CM | POA: Diagnosis not present

## 2022-09-24 DIAGNOSIS — M9902 Segmental and somatic dysfunction of thoracic region: Secondary | ICD-10-CM | POA: Diagnosis not present

## 2022-09-24 DIAGNOSIS — M5134 Other intervertebral disc degeneration, thoracic region: Secondary | ICD-10-CM | POA: Diagnosis not present

## 2022-10-07 DIAGNOSIS — M5134 Other intervertebral disc degeneration, thoracic region: Secondary | ICD-10-CM | POA: Diagnosis not present

## 2022-10-07 DIAGNOSIS — M9902 Segmental and somatic dysfunction of thoracic region: Secondary | ICD-10-CM | POA: Diagnosis not present

## 2022-10-08 DIAGNOSIS — M0600 Rheumatoid arthritis without rheumatoid factor, unspecified site: Secondary | ICD-10-CM | POA: Diagnosis not present

## 2022-10-08 DIAGNOSIS — Z79899 Other long term (current) drug therapy: Secondary | ICD-10-CM | POA: Diagnosis not present

## 2022-10-08 DIAGNOSIS — G4700 Insomnia, unspecified: Secondary | ICD-10-CM | POA: Diagnosis not present

## 2022-10-08 DIAGNOSIS — M810 Age-related osteoporosis without current pathological fracture: Secondary | ICD-10-CM | POA: Diagnosis not present

## 2022-10-08 DIAGNOSIS — M797 Fibromyalgia: Secondary | ICD-10-CM | POA: Diagnosis not present

## 2022-10-08 DIAGNOSIS — M1990 Unspecified osteoarthritis, unspecified site: Secondary | ICD-10-CM | POA: Diagnosis not present

## 2022-10-08 DIAGNOSIS — M542 Cervicalgia: Secondary | ICD-10-CM | POA: Diagnosis not present

## 2022-10-08 DIAGNOSIS — M353 Polymyalgia rheumatica: Secondary | ICD-10-CM | POA: Diagnosis not present

## 2022-10-08 DIAGNOSIS — M25561 Pain in right knee: Secondary | ICD-10-CM | POA: Diagnosis not present

## 2022-11-19 DIAGNOSIS — M7918 Myalgia, other site: Secondary | ICD-10-CM | POA: Diagnosis not present

## 2022-11-27 DIAGNOSIS — B079 Viral wart, unspecified: Secondary | ICD-10-CM | POA: Diagnosis not present

## 2022-12-08 DIAGNOSIS — E785 Hyperlipidemia, unspecified: Secondary | ICD-10-CM | POA: Diagnosis not present

## 2022-12-08 DIAGNOSIS — E538 Deficiency of other specified B group vitamins: Secondary | ICD-10-CM | POA: Diagnosis not present

## 2022-12-15 DIAGNOSIS — M62830 Muscle spasm of back: Secondary | ICD-10-CM | POA: Diagnosis not present

## 2022-12-15 DIAGNOSIS — F332 Major depressive disorder, recurrent severe without psychotic features: Secondary | ICD-10-CM | POA: Diagnosis not present

## 2022-12-15 DIAGNOSIS — E559 Vitamin D deficiency, unspecified: Secondary | ICD-10-CM | POA: Diagnosis not present

## 2022-12-15 DIAGNOSIS — E039 Hypothyroidism, unspecified: Secondary | ICD-10-CM | POA: Diagnosis not present

## 2022-12-15 DIAGNOSIS — M810 Age-related osteoporosis without current pathological fracture: Secondary | ICD-10-CM | POA: Diagnosis not present

## 2023-01-14 DIAGNOSIS — M722 Plantar fascial fibromatosis: Secondary | ICD-10-CM | POA: Diagnosis not present

## 2023-01-14 DIAGNOSIS — M79671 Pain in right foot: Secondary | ICD-10-CM | POA: Diagnosis not present

## 2023-02-04 DIAGNOSIS — M76821 Posterior tibial tendinitis, right leg: Secondary | ICD-10-CM | POA: Diagnosis not present

## 2023-02-04 DIAGNOSIS — M722 Plantar fascial fibromatosis: Secondary | ICD-10-CM | POA: Diagnosis not present

## 2023-02-09 ENCOUNTER — Ambulatory Visit: Payer: Medicare PPO | Admitting: Cardiology

## 2023-02-24 ENCOUNTER — Ambulatory Visit: Payer: Medicare PPO | Attending: Cardiology | Admitting: Cardiology

## 2023-02-24 ENCOUNTER — Encounter: Payer: Self-pay | Admitting: Cardiology

## 2023-02-24 VITALS — BP 134/82 | HR 64 | Ht 66.0 in | Wt 145.8 lb

## 2023-02-24 DIAGNOSIS — I77810 Thoracic aortic ectasia: Secondary | ICD-10-CM | POA: Diagnosis not present

## 2023-02-24 DIAGNOSIS — M7989 Other specified soft tissue disorders: Secondary | ICD-10-CM

## 2023-02-24 DIAGNOSIS — I351 Nonrheumatic aortic (valve) insufficiency: Secondary | ICD-10-CM | POA: Diagnosis not present

## 2023-02-24 DIAGNOSIS — R030 Elevated blood-pressure reading, without diagnosis of hypertension: Secondary | ICD-10-CM | POA: Diagnosis not present

## 2023-02-24 NOTE — Patient Instructions (Addendum)
Medication Instructions:   No changes  *If you need a refill on your cardiac medications before your next appointment, please call your pharmacy*   Lab Work: Not needed .   Testing/Procedures: Will schedule at [address] Your physician has requested that you have an echocardiogram. Echocardiography is a painless test that uses sound waves to create images of your heart. It provides your doctor with information about the size and shape of your heart and how well your heart's chambers and valves are working. This procedure takes approximately one hour. There are no restrictions for this procedure. Please do NOT wear cologne, perfume, aftershave, or lotions (deodorant is allowed). Please arrive 15 minutes prior to your appointment time.    Follow-Up: At [facility], you and your health needs are our priority.  As part of our continuing mission to provide you with exceptional heart care, we have created designated Provider Care Teams.  These Care Teams include your primary Cardiologist (physician) and Advanced Practice Providers (APPs -  Physician Assistants and Nurse Practitioners) who all work together to provide you with the care you need, when you need it.     Your next appointment:   12 month(s)  The format for your next appointment:   In Person  Provider:   Bryan Lemma, MD    Other Instructions

## 2023-02-24 NOTE — Progress Notes (Signed)
Cardiology Office Note:  .   Date:  02/28/2023  ID:  Andrea Mata, DOB 01-Oct-1945, MRN [id number] PCP: Andrea Brunette, MD  [facility] Providers Cardiologist:  Bryan Lemma, MD     Chief Complaint  Patient presents with   Follow-up    Doing well.  No major issues   Cardiac Valve Problem    Aortic insufficiency on echo    Patient Profile: .     Andrea Mata is a relatively healthy 77 y.o. female with a PMH notable for fibromyalgia/PMR and mild to moderate AI with dilated thoracic aorta who presents here for 2-year follow-up at the request of Andrea Brunette, MD.  Notable PMH: Thoracic aortic ectasia (not significant based on CTA Chest-Aorta and moderate AI on echo.     Andrea Mata was last seen on 02/17/2021 -> The plan was to reassess Echo in 2 years to reassess mitral regurgitation and aortic valve/ascending aorta.    Subjective  Discussed the use of AI scribe software for clinical note transcription with the patient, who gave verbal consent to proceed.  History of Present Illness   The patient, a 77 year old with a history of PMR managed with prednisone, presented with occasional dizziness, described as a feeling of imbalance. The patient reported that the dizziness was not associated with positional changes and had lasted all day on one occasion. She denies having symptoms of chest pain, pressure, or shortness of breath. However, she dos report occasional swelling in the legs, which improves with elevation.  The patient continues to remain active, with daily activities including walking the dog and household chores. She denies any sudden onset weakness, numbness on one side, slurred speech, drooling, or visual field cuts. The patient also denied any blood in the stool or nosebleeds.  The patient was not on any blood pressure or cholesterol medications, and her LDL cholesterol was slightly elevated. She reported occasional palpitations, described as a  'little flare,' but these were rare.  The patient's blood pressure is slightly elevated during the visit, which she attributes to rushing to the appointment. She denies any symptoms of worsening aortic valve disease, such as increased shortness of breath, fatigue, or chest discomfort. The patient remains hopeful about her health, expressing a desire to maintain her current level of activity and independence.      Cardiovascular ROS: no chest pain or dyspnea on exertion positive for - mild dizziness and occasional palpitations negative for - edema, irregular heartbeat, orthopnea, paroxysmal nocturnal dyspnea, rapid heart rate, shortness of breath, or syncope or near syncope or TIA or amaurosis fugax, medication  ROS:  Review of Systems - Negative except noted above    Objective   Studies Reviewed: Marland Kitchen   EKG Interpretation Date/Time:  Wednesday February 24 2023 11:40:41 EDT Ventricular Rate:  64 PR Interval:  150 QRS Duration:  134 QT Interval:  430 QTC Calculation: 443 R Axis:   82  Text Interpretation: Normal sinus rhythm Right bundle branch block No previous ECGs available Confirmed by Bryan Lemma ([id number]) on 02/24/2023 11:50:08 AM    ECHO June 2020: Normal EF of 60 to 65%.  GR 2 DD.  Mild aortic valve thickening with mild AI.  Mild dilation of ascending aorta. CT Angiogram Chest-Aorta July 2022: Aortic root-valve 2.8 cm, sinus 3.3 cm, sinotubular junction 3.0 cm thoracic aorta: Ascending 3.6 cm, arch 2.8 cm, descending 2.6 cm.  Aortic atherosclerosis noted.  Risk Assessment/Calculations:        Physical Exam:  VS:  BP 134/82   Pulse 64   Ht 5\' 6"  (1.676 m)   Wt 145 lb 12.8 oz (66.1 kg)   SpO2 97%   BMI 23.53 kg/m    Wt Readings from Last 3 Encounters:  02/24/23 145 lb 12.8 oz (66.1 kg)  02/17/21 159 lb 9.6 oz (72.4 kg)  11/13/20 159 lb (72.1 kg)    GEN: Well nourished, well developed in no acute distress; healthy-appearing.  Well-groomed. NECK: No JVD; No carotid  bruits CARDIAC: RRR, with distant but normal S1, S2; 1/6 SEM at RUSB.  Otherwise no additional murmurs, rubs, gallops RESPIRATORY:  Clear to auscultation without rales, wheezing or rhonchi ; nonlabored, good air movement. ABDOMEN: Soft, non-tender, non-distended EXTREMITIES:  No edema; No deformity     ASSESSMENT AND PLAN: .    Problem List Items Addressed This Visit       Cardiology Problems   Aortic ectasia, thoracic (HCC) - Primary (Chronic)    CTA of the chest revealed relatively normal sizes of the thoracic aorta.  No need to follow-up further.  Has been seen by Dr. Lavinia Sharps in the past.      Relevant Orders   EKG 12-Lead (Completed)   ECHOCARDIOGRAM COMPLETE   Moderate aortic regurgitation (Chronic)    Initially seem to have moderate AI, follow-up echo showed only mild AI.  Has been 4 years since last echo.  No symptoms of chest pain, shortness of breath, PND/orthopnea, or heart racing. Occasional dizziness, possibly related to inner ear issues. Mild leg swelling, possibly due to venous insufficiency. -Order echocardiogram to assess valve function and aortic size. -If stable, consider rechecking in 4 years.      Relevant Orders   EKG 12-Lead (Completed)   ECHOCARDIOGRAM COMPLETE     Other   Elevated blood pressure reading without diagnosis of hypertension (Chronic)    Elevated blood pressure at today's visit, possibly due to rushing and stress. No current antihypertensive medications. -Monitor blood pressure at home. -Consider starting antihypertensive medication if aortic valve shows worsening on echocardiogram or if blood pressure remains consistently elevated.      Leg swelling (Chronic)    Mild lower extremity swelling on occasion. -Consider use of support socks for leg swelling, especially during prolonged periods of standing or sitting.       General Health Maintenance -Continue regular follow-ups with primary care provider and specialists.        Follow-Up:  Return in about 1 year (around 02/24/2024) for 1 Yr Follow-up, Routine follow up with me. -Return for follow-up in 1 year, or sooner if any new symptoms develop.   Total time spent: 14 min spent with patient + 13 min spent charting = 27 min      Signed, Marykay Lex, MD, MS Bryan Lemma, M.D., M.S. Interventional Cardiologist  [facility]  Pager # [phone] Phone # [phone] [address] [place], [place] [address]

## 2023-02-28 ENCOUNTER — Encounter: Payer: Self-pay | Admitting: Cardiology

## 2023-02-28 DIAGNOSIS — M7989 Other specified soft tissue disorders: Secondary | ICD-10-CM | POA: Insufficient documentation

## 2023-02-28 DIAGNOSIS — R030 Elevated blood-pressure reading, without diagnosis of hypertension: Secondary | ICD-10-CM | POA: Insufficient documentation

## 2023-02-28 NOTE — Assessment & Plan Note (Signed)
Initially seem to have moderate AI, follow-up echo showed only mild AI.  Has been 4 years since last echo.  No symptoms of chest pain, shortness of breath, PND/orthopnea, or heart racing. Occasional dizziness, possibly related to inner ear issues. Mild leg swelling, possibly due to venous insufficiency. -Order echocardiogram to assess valve function and aortic size. -If stable, consider rechecking in 4 years.

## 2023-02-28 NOTE — Assessment & Plan Note (Signed)
Elevated blood pressure at today's visit, possibly due to rushing and stress. No current antihypertensive medications. -Monitor blood pressure at home. -Consider starting antihypertensive medication if aortic valve shows worsening on echocardiogram or if blood pressure remains consistently elevated.

## 2023-02-28 NOTE — Assessment & Plan Note (Signed)
Mild lower extremity swelling on occasion. -Consider use of support socks for leg swelling, especially during prolonged periods of standing or sitting.

## 2023-02-28 NOTE — Assessment & Plan Note (Signed)
CTA of the chest revealed relatively normal sizes of the thoracic aorta.  No need to follow-up further.  Has been seen by Dr. Lavinia Sharps in the past.

## 2023-03-01 DIAGNOSIS — M810 Age-related osteoporosis without current pathological fracture: Secondary | ICD-10-CM | POA: Diagnosis not present

## 2023-03-03 ENCOUNTER — Other Ambulatory Visit: Payer: Self-pay | Admitting: Internal Medicine

## 2023-03-03 DIAGNOSIS — Z1231 Encounter for screening mammogram for malignant neoplasm of breast: Secondary | ICD-10-CM

## 2023-03-04 DIAGNOSIS — F329 Major depressive disorder, single episode, unspecified: Secondary | ICD-10-CM | POA: Diagnosis not present

## 2023-03-04 DIAGNOSIS — R1011 Right upper quadrant pain: Secondary | ICD-10-CM | POA: Diagnosis not present

## 2023-03-04 DIAGNOSIS — R634 Abnormal weight loss: Secondary | ICD-10-CM | POA: Diagnosis not present

## 2023-03-04 DIAGNOSIS — M545 Low back pain, unspecified: Secondary | ICD-10-CM | POA: Diagnosis not present

## 2023-03-04 DIAGNOSIS — Z800 Family history of malignant neoplasm of digestive organs: Secondary | ICD-10-CM | POA: Diagnosis not present

## 2023-03-10 DIAGNOSIS — R1011 Right upper quadrant pain: Secondary | ICD-10-CM | POA: Diagnosis not present

## 2023-03-10 DIAGNOSIS — K760 Fatty (change of) liver, not elsewhere classified: Secondary | ICD-10-CM | POA: Diagnosis not present

## 2023-03-16 DIAGNOSIS — M545 Low back pain, unspecified: Secondary | ICD-10-CM | POA: Diagnosis not present

## 2023-03-16 DIAGNOSIS — E785 Hyperlipidemia, unspecified: Secondary | ICD-10-CM | POA: Diagnosis not present

## 2023-03-16 DIAGNOSIS — E039 Hypothyroidism, unspecified: Secondary | ICD-10-CM | POA: Diagnosis not present

## 2023-03-16 DIAGNOSIS — F419 Anxiety disorder, unspecified: Secondary | ICD-10-CM | POA: Diagnosis not present

## 2023-03-16 DIAGNOSIS — M810 Age-related osteoporosis without current pathological fracture: Secondary | ICD-10-CM | POA: Diagnosis not present

## 2023-03-16 DIAGNOSIS — I129 Hypertensive chronic kidney disease with stage 1 through stage 4 chronic kidney disease, or unspecified chronic kidney disease: Secondary | ICD-10-CM | POA: Diagnosis not present

## 2023-03-16 DIAGNOSIS — G4700 Insomnia, unspecified: Secondary | ICD-10-CM | POA: Diagnosis not present

## 2023-03-16 DIAGNOSIS — E559 Vitamin D deficiency, unspecified: Secondary | ICD-10-CM | POA: Diagnosis not present

## 2023-03-16 DIAGNOSIS — M1990 Unspecified osteoarthritis, unspecified site: Secondary | ICD-10-CM | POA: Diagnosis not present

## 2023-03-29 ENCOUNTER — Ambulatory Visit (HOSPITAL_COMMUNITY): Payer: Medicare PPO | Attending: Cardiology

## 2023-03-29 DIAGNOSIS — I77810 Thoracic aortic ectasia: Secondary | ICD-10-CM | POA: Diagnosis not present

## 2023-03-29 DIAGNOSIS — I351 Nonrheumatic aortic (valve) insufficiency: Secondary | ICD-10-CM | POA: Diagnosis not present

## 2023-03-29 LAB — ECHOCARDIOGRAM COMPLETE
AR max vel: 1.82 cm2
AV Area VTI: 1.84 cm2
AV Area mean vel: 1.75 cm2
AV Mean grad: 6 mm[Hg]
AV Peak grad: 11 mm[Hg]
Ao pk vel: 1.66 m/s
Area-P 1/2: 2.52 cm2
P 1/2 time: 441 ms
S' Lateral: 2.6 cm

## 2023-03-30 ENCOUNTER — Ambulatory Visit: Payer: Medicare PPO

## 2023-04-01 ENCOUNTER — Other Ambulatory Visit: Payer: Self-pay | Admitting: Student

## 2023-04-01 DIAGNOSIS — R1310 Dysphagia, unspecified: Secondary | ICD-10-CM

## 2023-04-01 DIAGNOSIS — R109 Unspecified abdominal pain: Secondary | ICD-10-CM | POA: Diagnosis not present

## 2023-04-08 ENCOUNTER — Inpatient Hospital Stay
Admission: RE | Admit: 2023-04-08 | Discharge: 2023-04-08 | Discharge status: Home / Self Care | Payer: Medicare PPO | Source: Ambulatory Visit | Attending: Student

## 2023-04-08 DIAGNOSIS — R109 Unspecified abdominal pain: Secondary | ICD-10-CM

## 2023-04-08 DIAGNOSIS — I351 Nonrheumatic aortic (valve) insufficiency: Secondary | ICD-10-CM | POA: Diagnosis not present

## 2023-04-08 DIAGNOSIS — I700 Atherosclerosis of aorta: Secondary | ICD-10-CM | POA: Diagnosis not present

## 2023-04-08 DIAGNOSIS — D539 Nutritional anemia, unspecified: Secondary | ICD-10-CM | POA: Diagnosis not present

## 2023-04-08 MED ORDER — IOPAMIDOL (ISOVUE-300) INJECTION 61%
100.0000 mL | Freq: Once | INTRAVENOUS | Status: AC | PRN
  Administered 2023-04-08: 100 mL via INTRAVENOUS

## 2023-04-13 ENCOUNTER — Ambulatory Visit
Admission: RE | Admit: 2023-04-13 | Discharge: 2023-04-13 | Disposition: A | Discharge status: Home / Self Care | Payer: Medicare PPO | Source: Ambulatory Visit | Attending: Internal Medicine | Admitting: Internal Medicine

## 2023-04-13 DIAGNOSIS — Z1231 Encounter for screening mammogram for malignant neoplasm of breast: Secondary | ICD-10-CM

## 2023-04-27 ENCOUNTER — Ambulatory Visit
Admission: RE | Admit: 2023-04-27 | Discharge: 2023-04-27 | Disposition: A | Discharge status: Home / Self Care | Payer: Medicare PPO | Source: Ambulatory Visit | Attending: Student

## 2023-04-27 DIAGNOSIS — I700 Atherosclerosis of aorta: Secondary | ICD-10-CM | POA: Diagnosis not present

## 2023-04-27 DIAGNOSIS — R1310 Dysphagia, unspecified: Secondary | ICD-10-CM | POA: Diagnosis not present

## 2023-04-27 DIAGNOSIS — D539 Nutritional anemia, unspecified: Secondary | ICD-10-CM | POA: Diagnosis not present

## 2023-04-27 DIAGNOSIS — I351 Nonrheumatic aortic (valve) insufficiency: Secondary | ICD-10-CM | POA: Diagnosis not present

## 2023-06-15 DIAGNOSIS — Z0000 Encounter for general adult medical examination without abnormal findings: Secondary | ICD-10-CM | POA: Diagnosis not present

## 2023-06-15 DIAGNOSIS — R5383 Other fatigue: Secondary | ICD-10-CM | POA: Diagnosis not present

## 2023-06-15 DIAGNOSIS — E039 Hypothyroidism, unspecified: Secondary | ICD-10-CM | POA: Diagnosis not present

## 2023-06-15 DIAGNOSIS — E559 Vitamin D deficiency, unspecified: Secondary | ICD-10-CM | POA: Diagnosis not present

## 2023-06-15 DIAGNOSIS — D509 Iron deficiency anemia, unspecified: Secondary | ICD-10-CM | POA: Diagnosis not present

## 2023-06-15 DIAGNOSIS — E785 Hyperlipidemia, unspecified: Secondary | ICD-10-CM | POA: Diagnosis not present

## 2023-06-24 DIAGNOSIS — Z0000 Encounter for general adult medical examination without abnormal findings: Secondary | ICD-10-CM | POA: Diagnosis not present

## 2023-06-24 DIAGNOSIS — M353 Polymyalgia rheumatica: Secondary | ICD-10-CM | POA: Diagnosis not present

## 2023-06-24 DIAGNOSIS — M810 Age-related osteoporosis without current pathological fracture: Secondary | ICD-10-CM | POA: Diagnosis not present

## 2023-06-24 DIAGNOSIS — S39012A Strain of muscle, fascia and tendon of lower back, initial encounter: Secondary | ICD-10-CM | POA: Diagnosis not present

## 2023-06-24 DIAGNOSIS — E039 Hypothyroidism, unspecified: Secondary | ICD-10-CM | POA: Diagnosis not present

## 2023-06-29 DIAGNOSIS — M7918 Myalgia, other site: Secondary | ICD-10-CM | POA: Diagnosis not present

## 2023-07-15 DIAGNOSIS — M7042 Prepatellar bursitis, left knee: Secondary | ICD-10-CM | POA: Diagnosis not present

## 2023-07-22 DIAGNOSIS — M791 Myalgia, unspecified site: Secondary | ICD-10-CM | POA: Diagnosis not present

## 2023-07-27 DIAGNOSIS — M791 Myalgia, unspecified site: Secondary | ICD-10-CM | POA: Diagnosis not present

## 2023-08-03 DIAGNOSIS — K219 Gastro-esophageal reflux disease without esophagitis: Secondary | ICD-10-CM | POA: Diagnosis not present

## 2023-08-04 DIAGNOSIS — M791 Myalgia, unspecified site: Secondary | ICD-10-CM | POA: Diagnosis not present

## 2023-08-25 DIAGNOSIS — H04123 Dry eye syndrome of bilateral lacrimal glands: Secondary | ICD-10-CM | POA: Diagnosis not present

## 2023-08-25 DIAGNOSIS — H0102A Squamous blepharitis right eye, upper and lower eyelids: Secondary | ICD-10-CM | POA: Diagnosis not present

## 2023-08-25 DIAGNOSIS — H35371 Puckering of macula, right eye: Secondary | ICD-10-CM | POA: Diagnosis not present

## 2023-08-25 DIAGNOSIS — Z961 Presence of intraocular lens: Secondary | ICD-10-CM | POA: Diagnosis not present

## 2023-08-25 DIAGNOSIS — H11041 Peripheral pterygium, stationary, right eye: Secondary | ICD-10-CM | POA: Diagnosis not present

## 2023-08-31 DIAGNOSIS — Z79899 Other long term (current) drug therapy: Secondary | ICD-10-CM | POA: Diagnosis not present

## 2023-08-31 DIAGNOSIS — M797 Fibromyalgia: Secondary | ICD-10-CM | POA: Diagnosis not present

## 2023-08-31 DIAGNOSIS — M0600 Rheumatoid arthritis without rheumatoid factor, unspecified site: Secondary | ICD-10-CM | POA: Diagnosis not present

## 2023-08-31 DIAGNOSIS — M1990 Unspecified osteoarthritis, unspecified site: Secondary | ICD-10-CM | POA: Diagnosis not present

## 2023-08-31 DIAGNOSIS — M542 Cervicalgia: Secondary | ICD-10-CM | POA: Diagnosis not present

## 2023-08-31 DIAGNOSIS — M25561 Pain in right knee: Secondary | ICD-10-CM | POA: Diagnosis not present

## 2023-08-31 DIAGNOSIS — M353 Polymyalgia rheumatica: Secondary | ICD-10-CM | POA: Diagnosis not present

## 2023-08-31 DIAGNOSIS — M810 Age-related osteoporosis without current pathological fracture: Secondary | ICD-10-CM | POA: Diagnosis not present

## 2023-08-31 DIAGNOSIS — G4700 Insomnia, unspecified: Secondary | ICD-10-CM | POA: Diagnosis not present

## 2023-10-04 DIAGNOSIS — K219 Gastro-esophageal reflux disease without esophagitis: Secondary | ICD-10-CM | POA: Diagnosis not present

## 2023-10-04 DIAGNOSIS — R634 Abnormal weight loss: Secondary | ICD-10-CM | POA: Diagnosis not present

## 2023-10-11 ENCOUNTER — Other Ambulatory Visit: Payer: Self-pay | Admitting: Surgery

## 2023-10-11 DIAGNOSIS — I77810 Thoracic aortic ectasia: Secondary | ICD-10-CM

## 2023-10-19 DIAGNOSIS — R634 Abnormal weight loss: Secondary | ICD-10-CM | POA: Diagnosis not present

## 2023-10-19 DIAGNOSIS — K2289 Other specified disease of esophagus: Secondary | ICD-10-CM | POA: Diagnosis not present

## 2023-10-19 DIAGNOSIS — D124 Benign neoplasm of descending colon: Secondary | ICD-10-CM | POA: Diagnosis not present

## 2023-10-19 DIAGNOSIS — K2940 Chronic atrophic gastritis without bleeding: Secondary | ICD-10-CM | POA: Diagnosis not present

## 2023-10-19 DIAGNOSIS — Z860101 Personal history of adenomatous and serrated colon polyps: Secondary | ICD-10-CM | POA: Diagnosis not present

## 2023-10-19 DIAGNOSIS — K2930 Chronic superficial gastritis without bleeding: Secondary | ICD-10-CM | POA: Diagnosis not present

## 2023-10-19 DIAGNOSIS — K31A19 Gastric intestinal metaplasia without dysplasia, unspecified site: Secondary | ICD-10-CM | POA: Diagnosis not present

## 2023-10-19 DIAGNOSIS — Z09 Encounter for follow-up examination after completed treatment for conditions other than malignant neoplasm: Secondary | ICD-10-CM | POA: Diagnosis not present

## 2023-10-19 DIAGNOSIS — D125 Benign neoplasm of sigmoid colon: Secondary | ICD-10-CM | POA: Diagnosis not present

## 2023-10-19 DIAGNOSIS — B3781 Candidal esophagitis: Secondary | ICD-10-CM | POA: Diagnosis not present

## 2023-10-26 DIAGNOSIS — K2940 Chronic atrophic gastritis without bleeding: Secondary | ICD-10-CM | POA: Diagnosis not present

## 2023-10-26 DIAGNOSIS — B3781 Candidal esophagitis: Secondary | ICD-10-CM | POA: Diagnosis not present

## 2023-10-26 DIAGNOSIS — K2289 Other specified disease of esophagus: Secondary | ICD-10-CM | POA: Diagnosis not present

## 2023-10-26 DIAGNOSIS — D124 Benign neoplasm of descending colon: Secondary | ICD-10-CM | POA: Diagnosis not present

## 2023-10-26 DIAGNOSIS — K31A19 Gastric intestinal metaplasia without dysplasia, unspecified site: Secondary | ICD-10-CM | POA: Diagnosis not present

## 2023-10-26 DIAGNOSIS — D125 Benign neoplasm of sigmoid colon: Secondary | ICD-10-CM | POA: Diagnosis not present

## 2023-11-04 ENCOUNTER — Encounter: Payer: Self-pay | Admitting: Oncology

## 2023-11-04 ENCOUNTER — Ambulatory Visit (HOSPITAL_COMMUNITY)
Admission: RE | Admit: 2023-11-04 | Discharge: 2023-11-04 | Disposition: A | Discharge status: Home / Self Care | Source: Ambulatory Visit | Attending: Surgery | Admitting: Surgery

## 2023-11-04 ENCOUNTER — Telehealth: Payer: Self-pay | Admitting: Gastroenterology

## 2023-11-04 DIAGNOSIS — I77810 Thoracic aortic ectasia: Secondary | ICD-10-CM | POA: Diagnosis not present

## 2023-11-04 DIAGNOSIS — I700 Atherosclerosis of aorta: Secondary | ICD-10-CM | POA: Insufficient documentation

## 2023-11-04 MED ORDER — IOHEXOL 350 MG/ML SOLN
100.0000 mL | Freq: Once | INTRAVENOUS | Status: AC | PRN
  Administered 2023-11-04: 100 mL via INTRAVENOUS

## 2023-11-04 NOTE — Progress Notes (Signed)
 HPI: Ms. 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 She was last seen by Dr. Lucas 3 years ago when the CTA chest showed the aortic dimensions to be stable. She returns today for scheduled follow up.  She reports no changes in her health since her last visit except for the finding of a lesion on upper endoscopy that her gastroenterologist plans to resect by way of EGD in the near future.  She said this was not cancer.  She denies having any chest pain or back pain or shortness of breath.    Current Outpatient Medications  Medication Sig Dispense Refill   buPROPion (WELLBUTRIN XL) 150 MG 24 hr tablet Take 150 mg by mouth every morning.     Cholecalciferol 25 MCG (1000 UT) capsule Take 1,000 Units by mouth 2 (two) times daily.     cyanocobalamin  (,VITAMIN B-12,) 1000 MCG/ML injection INJECT 1ML IM ONCE MONTHLY  1   diclofenac Sodium (VOLTAREN) 1 % GEL Apply 2 g topically 4 (four) times daily.     DULoxetine (CYMBALTA) 60 MG capsule Take 60 mg by mouth daily.      folic acid (FOLVITE) 1 MG tablet Take 2 mg by mouth daily.  3   levothyroxine (SYNTHROID) 50 MCG tablet Take 50 mcg by mouth every morning.     LYRICA 100 MG capsule Take 100 mg by mouth 2 (two) times daily.  1   meloxicam (MOBIC) 7.5 MG tablet Take 7.5 mg by mouth daily.     methotrexate (RHEUMATREX) 2.5 MG tablet Take 5 tablets by mouth once a week.  3   predniSONE (DELTASONE) 2.5 MG tablet Take 2.5 mg by mouth daily.     traZODone (DESYREL) 50 MG tablet Take 50 mg by mouth at bedtime.       No current facility-administered medications for this visit.    Physical Exam Vital signs BP 137/70 Heart rate 60 Respirations 18 SpO2 98% on room air  General: Very pleasant 78 year old female in no distress. Heart: Regular rate and rhythm, no murmur Chest: Breath sounds are clear to  auscultation with normal work of breathing on room air. Extremities: All warm and well-perfused without peripheral edema. Neuro: Grossly intact   Diagnostic Tests:  CLINICAL DATA:  Thoracic aortic aneurysm   EXAM: CT ANGIOGRAPHY CHEST WITH CONTRAST   TECHNIQUE: Multidetector CT imaging of the chest was performed using the standard protocol during bolus administration of intravenous contrast. Multiplanar CT image reconstructions and MIPs were obtained to evaluate the vascular anatomy.   RADIATION DOSE REDUCTION: This exam was performed according to the departmental dose-optimization program which includes automated exposure control, adjustment of the mA and/or kV according to patient size and/or use of iterative reconstruction technique.   CONTRAST:  OMNIPAQUE  IOHEXOL  350 MG/ML SOLN   COMPARISON:  11/13/2020   FINDINGS: Cardiovascular: The thoracic aorta is normal in course and caliber. No intramural hematoma, dissection, or aneurysm. Mild atherosclerotic calcification. Type 2 aortic arch. Arch vasculature demonstrates classic anatomic configuration and is widely patent proximally.   No significant coronary artery calcification. Cardiac size within normal limits. No pericardial effusion. Central pulmonary arteries are of normal caliber.   Mediastinum/Nodes: No enlarged mediastinal, hilar, or axillary lymph nodes. Thyroid  gland, trachea, and esophagus demonstrate no significant findings.   Lungs/Pleura: Stable ground-glass opacity will basilar lingula in keeping with mild parenchymal scarring.  Lungs are otherwise clear. No pneumothorax or pleural effusion. No central obstructing lesion.   Upper Abdomen: No acute abnormality.   Musculoskeletal: Multiple remote right rib fractures noted. No acute bone abnormality. No lytic or blastic bone lesion   Review of the MIP images confirms the above findings.   IMPRESSION: 1. No evidence of thoracic aortic aneurysm.  Mild atherosclerotic calcification.   Aortic Atherosclerosis (ICD10-I70.0).     Electronically Signed   By: Dorethia Molt M.D.   On: 11/07/2023 00:11    ECHOCARDIOGRAM REPORT       Patient Name:   DAREN DOSWELL Date of Exam: 03/29/2023  Medical Rec #:  [id number]         Height:       66.0 in  Accession #:    [id number]        Weight:       145.8 lb  Date of Birth:  Jun 24, 1945         BSA:          1.748 m  Patient Age:    77 years          BP:           134/82 mmHg  Patient Gender: F                 HR:           66 bpm.  Exam Location:  [address]   Procedure: 2D Echo, 3D Echo, Cardiac Doppler and Color Doppler   Indications:     I35.1 Aortic Insufficiency    History:         Patient has prior history of Echocardiogram examinations,  most                  recent 10/06/2018. Aortic Valve Disease,                   Signs/Symptoms:Dizziness/Lightheadedness and Edema; Risk                   Factors:Family History of Coronary Artery Disease,  Hypertension                  and Dyslipidemia.    Sonographer:     Heather Hawks RDCS  Referring Phys:  [facility]  Diagnosing Phys: Toribio Fuel MD   IMPRESSIONS     1. Left ventricular ejection fraction, by estimation, is 60 to 65%. The  left ventricle has normal function. The left ventricle has no regional  wall motion abnormalities. Left ventricular diastolic parameters are  consistent with Grade I diastolic  dysfunction (impaired relaxation).   2. Right ventricular systolic function is normal. The right ventricular  size is normal.   3. Left atrial size was mild to moderately dilated.   4. The mitral valve is normal in structure. Mild mitral valve  regurgitation. No evidence of mitral stenosis.   5. The aortic valve is tricuspid. There is mild calcification of the  aortic valve. Aortic valve regurgitation is mild. Aortic valve  sclerosis/calcification is present, without any evidence of aortic   stenosis. Aortic regurgitation PHT measures 441 msec.   6. The inferior vena cava is normal in size with greater than 50%  respiratory variability, suggesting right atrial pressure of 3 mmHg.   FINDINGS   Left Ventricle: Left ventricular ejection fraction, by estimation, is 60  to 65%. The left ventricle has normal function. The left ventricle has no  regional wall motion abnormalities. The left ventricular internal cavity  size was normal in size. There is   no left ventricular hypertrophy. Left ventricular diastolic parameters  are consistent with Grade I diastolic dysfunction (impaired relaxation).   Right Ventricle: The right ventricular size is normal. No increase in  right ventricular wall thickness. Right ventricular systolic function is  normal.   Left Atrium: Left atrial size was mild to moderately dilated.   Right Atrium: Right atrial size was normal in size.   Pericardium: There is no evidence of pericardial effusion.   Mitral Valve: The mitral valve is normal in structure. Mild mitral valve  regurgitation. No evidence of mitral valve stenosis.   Tricuspid Valve: The tricuspid valve is normal in structure. Tricuspid  valve regurgitation is trivial. No evidence of tricuspid stenosis.   Aortic Valve: The aortic valve is tricuspid. There is mild calcification  of the aortic valve. Aortic valve regurgitation is mild. Aortic  regurgitation PHT measures 441 msec. Aortic valve sclerosis/calcification  is present, without any evidence of aortic  stenosis. Aortic valve mean gradient measures 6.0 mmHg. Aortic valve peak  gradient measures 11.0 mmHg. Aortic valve area, by VTI measures 1.84 cm.   Pulmonic Valve: The pulmonic valve was normal in structure. Pulmonic valve  regurgitation is trivial. No evidence of pulmonic stenosis.   Aorta: The aortic root is normal in size and structure.   Venous: The inferior vena cava is normal in size with greater than 50%  respiratory  variability, suggesting right atrial pressure of 3 mmHg.   IAS/Shunts: No atrial level shunt detected by color flow Doppler.     LEFT VENTRICLE  PLAX 2D  LVIDd:         4.80 cm   Diastology  LVIDs:         2.60 cm   LV e' medial:    6.64 cm/s  LV PW:         0.80 cm   LV E/e' medial:  10.6  LV IVS:        0.90 cm   LV e' lateral:   10.80 cm/s  LVOT diam:     2.30 cm   LV E/e' lateral: 6.5  LV SV:         73  LV SV Index:   42  LVOT Area:     4.15 cm                             3D Volume EF:                           3D EF:        75 %                           LV EDV:       113 ml                           LV ESV:       28 ml                           LV SV:        85 ml   RIGHT VENTRICLE  RV Basal diam:  3.30 cm  RV S prime:  13.20 cm/s  TAPSE (M-mode): 1.9 cm  RVSP:           28.2 mmHg   LEFT ATRIUM             Index        RIGHT ATRIUM           Index  LA diam:        3.90 cm 2.23 cm/m   RA Pressure: 3.00 mmHg  LA Vol (A2C):   72.6 ml 41.52 ml/m  RA Area:     13.40 cm  LA Vol (A4C):   54.8 ml 31.34 ml/m  RA Volume:   33.90 ml  19.39 ml/m  LA Biplane Vol: 64.0 ml 36.60 ml/m   AORTIC VALVE  AV Area (Vmax):    1.82 cm  AV Area (Vmean):   1.75 cm  AV Area (VTI):     1.84 cm  AV Vmax:           166.00 cm/s  AV Vmean:          116.000 cm/s  AV VTI:            0.399 m  AV Peak Grad:      11.0 mmHg  AV Mean Grad:      6.0 mmHg  LVOT Vmax:         72.87 cm/s  LVOT Vmean:        48.950 cm/s  LVOT VTI:          0.176 m  LVOT/AV VTI ratio: 0.44  AI PHT:            441 msec    AORTA  Ao Root diam: 2.90 cm  Ao Asc diam:  3.70 cm   MITRAL VALVE               TRICUSPID VALVE  MV Area (PHT): cm         TR Peak grad:   25.2 mmHg  MV Decel Time: 301 msec    TR Vmax:        251.00 cm/s  MV E velocity: 70.40 cm/s  Estimated RAP:  3.00 mmHg  MV A velocity: 78.40 cm/s  RVSP:           28.2 mmHg  MV E/A ratio:  0.90                             SHUNTS                              Systemic VTI:  0.18 m                             Systemic Diam: 2.30 cm   Toribio Fuel MD  Electronically signed by Toribio Fuel MD  Signature Date/Time: 03/29/2023/3:30:36 PM        Final (Updated)     Impression / Plan: 78 yo female with 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  No indication for intervention.  This finding has been stable for several years and I explained that it was not imperative that we continue with surveillance.  She would like to have another follow-up in 3 years so we will arrange CTA and follow-up  visit at that interval.   Laurel JUDITHANN Becket, PA-C [facility] [phone]

## 2023-11-04 NOTE — Telephone Encounter (Signed)
 Dr. Wilhelmenia,  Please see referral from Dr. Elicia. 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  Referral placed on your desk for review.  Thank you, Corean

## 2023-11-06 NOTE — Telephone Encounter (Signed)
 Will discuss case a bit further with Dr. Elicia to try to see pictures. My availability is not until October. Will see his thoughts on the chance of putting this out till then.  Aloha Finner, MD  Gastroenterology [facility] Office # [phone]

## 2023-11-09 ENCOUNTER — Ambulatory Visit: Attending: Surgery | Admitting: Physician Assistant

## 2023-11-09 VITALS — BP 137/70 | HR 60 | Resp 18 | Ht 66.0 in | Wt 135.0 lb

## 2023-11-09 DIAGNOSIS — I77810 Thoracic aortic ectasia: Secondary | ICD-10-CM

## 2023-11-09 NOTE — Patient Instructions (Signed)
 Continue to maintain healthy blood pressure with a goal of less than 130/90.  Avoid strenuous activity with no lifting greater than 30 pounds  Will plan for follow-up CTA chest in 3 years.

## 2023-11-11 NOTE — Telephone Encounter (Signed)
 Thank you for letting me know.  Andrea Mata, Please cancel this referral in the system. GM

## 2023-12-08 DIAGNOSIS — D131 Benign neoplasm of stomach: Secondary | ICD-10-CM | POA: Diagnosis not present

## 2023-12-08 DIAGNOSIS — Z79899 Other long term (current) drug therapy: Secondary | ICD-10-CM | POA: Diagnosis not present

## 2023-12-08 DIAGNOSIS — E039 Hypothyroidism, unspecified: Secondary | ICD-10-CM | POA: Diagnosis not present

## 2023-12-08 DIAGNOSIS — M797 Fibromyalgia: Secondary | ICD-10-CM | POA: Diagnosis not present

## 2023-12-08 DIAGNOSIS — K31A0 Gastric intestinal metaplasia, unspecified: Secondary | ICD-10-CM | POA: Diagnosis not present

## 2023-12-09 DIAGNOSIS — E039 Hypothyroidism, unspecified: Secondary | ICD-10-CM | POA: Diagnosis not present

## 2023-12-09 DIAGNOSIS — M797 Fibromyalgia: Secondary | ICD-10-CM | POA: Diagnosis not present

## 2023-12-09 DIAGNOSIS — Z79899 Other long term (current) drug therapy: Secondary | ICD-10-CM | POA: Diagnosis not present

## 2023-12-09 DIAGNOSIS — Z9889 Other specified postprocedural states: Secondary | ICD-10-CM | POA: Diagnosis not present

## 2023-12-09 DIAGNOSIS — K31A0 Gastric intestinal metaplasia, unspecified: Secondary | ICD-10-CM | POA: Diagnosis not present

## 2023-12-09 DIAGNOSIS — D131 Benign neoplasm of stomach: Secondary | ICD-10-CM | POA: Diagnosis not present

## 2024-01-04 DIAGNOSIS — D131 Benign neoplasm of stomach: Secondary | ICD-10-CM | POA: Diagnosis not present

## 2024-01-04 DIAGNOSIS — K219 Gastro-esophageal reflux disease without esophagitis: Secondary | ICD-10-CM | POA: Diagnosis not present

## 2024-01-19 DIAGNOSIS — E559 Vitamin D deficiency, unspecified: Secondary | ICD-10-CM | POA: Diagnosis not present

## 2024-01-19 DIAGNOSIS — Z8639 Personal history of other endocrine, nutritional and metabolic disease: Secondary | ICD-10-CM | POA: Diagnosis not present

## 2024-01-19 DIAGNOSIS — G4700 Insomnia, unspecified: Secondary | ICD-10-CM | POA: Diagnosis not present

## 2024-01-19 DIAGNOSIS — M0600 Rheumatoid arthritis without rheumatoid factor, unspecified site: Secondary | ICD-10-CM | POA: Diagnosis not present

## 2024-01-19 DIAGNOSIS — R5383 Other fatigue: Secondary | ICD-10-CM | POA: Diagnosis not present

## 2024-01-19 DIAGNOSIS — F329 Major depressive disorder, single episode, unspecified: Secondary | ICD-10-CM | POA: Diagnosis not present

## 2024-01-19 DIAGNOSIS — E538 Deficiency of other specified B group vitamins: Secondary | ICD-10-CM | POA: Diagnosis not present

## 2024-02-03 DIAGNOSIS — Z23 Encounter for immunization: Secondary | ICD-10-CM | POA: Diagnosis not present

## 2024-02-03 DIAGNOSIS — F329 Major depressive disorder, single episode, unspecified: Secondary | ICD-10-CM | POA: Diagnosis not present

## 2024-02-03 DIAGNOSIS — G4700 Insomnia, unspecified: Secondary | ICD-10-CM | POA: Diagnosis not present

## 2024-02-03 DIAGNOSIS — J40 Bronchitis, not specified as acute or chronic: Secondary | ICD-10-CM | POA: Diagnosis not present

## 2024-02-03 DIAGNOSIS — F419 Anxiety disorder, unspecified: Secondary | ICD-10-CM | POA: Diagnosis not present

## 2024-02-03 DIAGNOSIS — R053 Chronic cough: Secondary | ICD-10-CM | POA: Diagnosis not present

## 2024-03-06 DIAGNOSIS — M810 Age-related osteoporosis without current pathological fracture: Secondary | ICD-10-CM | POA: Diagnosis not present
